# Patient Record
Sex: Male | Born: 1945
Health system: Southern US, Community
[De-identification: ages and names within clinical notes are randomized; demographics above are authoritative.]

## PROBLEM LIST (undated history)

## (undated) DIAGNOSIS — F419 Anxiety disorder, unspecified: Secondary | ICD-10-CM

## (undated) DIAGNOSIS — I10 Essential (primary) hypertension: Secondary | ICD-10-CM

## (undated) DIAGNOSIS — I671 Cerebral aneurysm, nonruptured: Secondary | ICD-10-CM

## (undated) HISTORY — PX: BRAIN SURGERY: SHX531

## (undated) HISTORY — PX: CATARACT EXTRACTION W/ INTRAOCULAR LENS IMPLANT: SHX1309

---

## 2005-01-28 ENCOUNTER — Emergency Department (HOSPITAL_COMMUNITY): Admission: EM | Admit: 2005-01-28 | Discharge: 2005-01-28 | Payer: Self-pay | Admitting: Emergency Medicine

## 2005-11-26 ENCOUNTER — Emergency Department (HOSPITAL_COMMUNITY): Admission: EM | Admit: 2005-11-26 | Discharge: 2005-11-26 | Payer: Self-pay | Admitting: Emergency Medicine

## 2005-11-28 ENCOUNTER — Ambulatory Visit (HOSPITAL_COMMUNITY): Admission: RE | Admit: 2005-11-28 | Discharge: 2005-11-28 | Payer: Self-pay | Admitting: Emergency Medicine

## 2005-12-05 ENCOUNTER — Encounter: Payer: Self-pay | Admitting: Interventional Radiology

## 2006-02-12 ENCOUNTER — Inpatient Hospital Stay (HOSPITAL_COMMUNITY): Admission: RE | Admit: 2006-02-12 | Discharge: 2006-02-13 | Payer: Self-pay | Admitting: Interventional Radiology

## 2006-02-14 ENCOUNTER — Emergency Department (HOSPITAL_COMMUNITY): Admission: EM | Admit: 2006-02-14 | Discharge: 2006-02-14 | Payer: Self-pay | Admitting: Emergency Medicine

## 2006-05-02 ENCOUNTER — Emergency Department (HOSPITAL_COMMUNITY): Admission: EM | Admit: 2006-05-02 | Discharge: 2006-05-02 | Payer: Self-pay | Admitting: Emergency Medicine

## 2006-06-19 ENCOUNTER — Ambulatory Visit (HOSPITAL_COMMUNITY): Admission: RE | Admit: 2006-06-19 | Discharge: 2006-06-19 | Payer: Self-pay | Admitting: Interventional Radiology

## 2006-07-26 ENCOUNTER — Emergency Department (HOSPITAL_COMMUNITY): Admission: EM | Admit: 2006-07-26 | Discharge: 2006-07-27 | Payer: Self-pay | Admitting: Emergency Medicine

## 2008-02-28 ENCOUNTER — Encounter: Payer: Self-pay | Admitting: Family Medicine

## 2008-03-25 ENCOUNTER — Inpatient Hospital Stay (HOSPITAL_COMMUNITY): Admission: RE | Admit: 2008-03-25 | Discharge: 2008-03-26 | Payer: Self-pay | Admitting: Interventional Radiology

## 2008-04-09 ENCOUNTER — Encounter: Payer: Self-pay | Admitting: Interventional Radiology

## 2008-07-17 ENCOUNTER — Ambulatory Visit (HOSPITAL_COMMUNITY): Admission: RE | Admit: 2008-07-17 | Discharge: 2008-07-17 | Payer: Self-pay | Admitting: Interventional Radiology

## 2008-12-31 ENCOUNTER — Ambulatory Visit (HOSPITAL_COMMUNITY): Admission: RE | Admit: 2008-12-31 | Discharge: 2008-12-31 | Payer: Self-pay | Admitting: Interventional Radiology

## 2009-06-28 ENCOUNTER — Ambulatory Visit (HOSPITAL_COMMUNITY): Admission: RE | Admit: 2009-06-28 | Discharge: 2009-06-28 | Payer: Self-pay | Admitting: Interventional Radiology

## 2010-04-05 ENCOUNTER — Ambulatory Visit (HOSPITAL_COMMUNITY): Admission: RE | Admit: 2010-04-05 | Discharge: 2010-04-05 | Payer: Self-pay | Admitting: Interventional Radiology

## 2010-12-18 ENCOUNTER — Encounter: Payer: Self-pay | Admitting: Interventional Radiology

## 2011-02-14 LAB — BASIC METABOLIC PANEL
BUN: 11 mg/dL (ref 6–23)
CO2: 27 mEq/L (ref 19–32)
Calcium: 8.8 mg/dL (ref 8.4–10.5)
Glucose, Bld: 100 mg/dL — ABNORMAL HIGH (ref 70–99)
Potassium: 3.9 mEq/L (ref 3.5–5.1)
Sodium: 137 mEq/L (ref 135–145)

## 2011-02-14 LAB — CBC
HCT: 42.2 % (ref 39.0–52.0)
Hemoglobin: 14.7 g/dL (ref 13.0–17.0)
MCHC: 34.9 g/dL (ref 30.0–36.0)
Platelets: 226 10*3/uL (ref 150–400)
RDW: 15.4 % (ref 11.5–15.5)

## 2011-03-04 LAB — CBC
HCT: 41.9 % (ref 39.0–52.0)
MCHC: 34 g/dL (ref 30.0–36.0)
MCV: 93.6 fL (ref 78.0–100.0)
Platelets: 193 10*3/uL (ref 150–400)
RDW: 15 % (ref 11.5–15.5)

## 2011-03-04 LAB — BASIC METABOLIC PANEL
BUN: 11 mg/dL (ref 6–23)
Chloride: 105 mEq/L (ref 96–112)
Glucose, Bld: 88 mg/dL (ref 70–99)
Potassium: 3.3 mEq/L — ABNORMAL LOW (ref 3.5–5.1)

## 2011-03-14 LAB — BASIC METABOLIC PANEL
BUN: 11 mg/dL (ref 6–23)
CO2: 26 mEq/L (ref 19–32)
Chloride: 102 mEq/L (ref 96–112)
Creatinine, Ser: 0.8 mg/dL (ref 0.4–1.5)
Glucose, Bld: 79 mg/dL (ref 70–99)

## 2011-03-14 LAB — PROTIME-INR: Prothrombin Time: 12.6 seconds (ref 11.6–15.2)

## 2011-03-14 LAB — CBC
MCHC: 35.1 g/dL (ref 30.0–36.0)
MCV: 90.1 fL (ref 78.0–100.0)
Platelets: 224 10*3/uL (ref 150–400)
RBC: 4.55 MIL/uL (ref 4.22–5.81)

## 2011-04-11 NOTE — Discharge Summary (Signed)
NAME:  Brett Elliott, Brett Elliott NO.:  [id number]   MEDICAL RECORD NO.:  [id number]          PATIENT TYPE:  INP   LOCATION:  3114                         FACILITY:  [facility]   PHYSICIAN:  Sanjeev K. Deveshwar, M.D.DATE OF BIRTH:  June 26, 1946   DATE OF ADMISSION:  03/25/2008  DATE OF DISCHARGE:  03/26/2008                               DISCHARGE SUMMARY   REFERRING PHYSICIAN:  Angus G. McInnis, MD   CHIEF COMPLAINT:  Cerebral aneurysm.   HISTORY OF PRESENT ILLNESS:  This is a pleasant 65 year old male who was  referred to Dr. Corliss Skains through the courtesy of Dr. Butch Penny in  2007 after this patient was found to have a right middle cerebral artery  aneurysm.  The patient was having headaches.  An MRI scan was performed  which did reveal the aneurysm.   On February 12, 2006 the patient underwent coiling of an 8 x 7 mm right  middle cerebral artery aneurysm performed by Dr. Corliss Skains under general  anesthesia.  The patient had some agitation when recovering from  anesthesia.  He also found it very uncomfortable to have a Foley  catheter placed   At the time of the coiling, Dr. Corliss Skains felt that the patient would  need a staged procedure in the future due to the wide neck of the  aneurysm.  He felt that stenting and possibly further coiling would be  needed.  The patient was scheduled for a followup procedure; however,  due to some family issues, the procedure was delayed and the patient  subsequently lost to followup.   The patient was seen again in consultation by Dr. Corliss Skains on February 28, 2008.  At that time further treatment options were discussed along with  the risks and benefits.  The patient was admitted to [facility]  on March 25, 2008, for further endovascular treatment.   PAST MEDICAL HISTORY:  Significant for,  1. Hyperlipidemia.  2. Hypertension.  3. History of borderline diabetes mellitus.  4. History of previous colonoscopy revealing a benign  polyp.   ALLERGIES:  The patient is allergic to [facility] which causes anaphylaxis.   PAST SURGICAL HISTORY:  The patient has had no major surgeries.  He does  have agitation when recovering from anesthesia following his coiling of  his aneurysm.   ADMISSION MEDICATIONS:  Include aspirin, Plavix, Ambien, omeprazole,  lorazepam, Diovan, and Hydrocodone.  The patient previously had been on  antidepressant; however, this was stopped at some point.   SOCIAL HISTORY:  The patient is married.  He has 5 children.  He has a  long history of tobacco use.  He smokes one to one-half pack of  cigarettes per day.  He did quit for 2 years after his aneurysm coiling;  however, he has since resumed.  He is trying to quit once again.  The  patient does not use alcohol.  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   FAMILY HISTORY:  His mother died at age 53 from colon cancer and a  hemorrhagic CVA.  His father died at age 73 from a cerebral aneurysm.   HOSPITAL COURSE:  As noted, this patient was admitted to [facility] on March 25, 2008, for further treatment of a right middle  cerebral artery aneurysm that was initially coiled on February 12, 2006.  The patient underwent a cerebral angiogram on the day of admission  followed by stent-assisted coiling under general anesthesia performed by  Dr. Corliss Skains.  There were no immediate or known complications.   The patient was then admitted to the neuro intensive care unit where he  remained on IV heparin overnight.  The heparin was discontinued the  following day.  The right femoral groin sheath was removed.  The patient  was placed on bedrest for 6 hours.   At the end of the bedrest, the patient will be ambulated.  If he remains  medically stable, he will be discharged in stable and improved  condition.   LABORATORY DATA:  A basic metabolic panel on the day of discharge  revealed a BUN of 5, creatinine  was 0.81, GFR was greater than 60,  potassium was 3.6, and glucose was 108.  The CBC on the day of discharge  revealed hemoglobin 12.8, hematocrit 37.5, WBCs 10.8000, and platelets  236,000.   DISCHARGE INSTRUCTIONS:  The patient was told to resume his previous  home medications.  He has been on continuous Plavix which he will also  resume along with aspirin 81 mg daily, which he has also been taking.   The patient was given instructions regarding his wound care.  He was  told not to drive or do anything strenuous for at least 2 weeks.  He  will follow up with Dr. Corliss Skains in approximately 2 weeks.  He was told  to see his primary care physician, Dr. Butch Penny, as needed or as  scheduled.   PROBLEMS AT THE TIME OF DISCHARGE:  1. Right middle cerebral artery aneurysm measuring 8 x 7 mm initially      coiled by Dr. Corliss Skains on February 12, 2006 as a staged procedure.  2. Stent-assisted coiling performed on the same aneurysm on March 25, 2008 as part of the staged procedure.  3. History of hyperlipidemia.  4. History of hypertension.  5. Questionable borderline diabetes.  6. History of benign colon polyps.  7. Ongoing tobacco use.  8. Agitation following anesthesia.  9. 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   As noted, the patient will follow up with Dr. Corliss Skains in 2 weeks.  He  was told to stay on a low-fat, low-sodium diet, and to avoid  concentrated sweets.      Delton See, P.A.    ______________________________  Grandville Silos. Corliss Skains, M.D.    DR/MEDQ  D:  03/26/2008  T:  03/27/2008  Job:  [id number]   cc:   Angus G. Renard Matter, MD

## 2011-04-11 NOTE — H&P (Signed)
NAME:  Brett Elliott, Brett Elliott NO.:  [id number]   MEDICAL RECORD NO.:  [id number]          PATIENT TYPE:  AMB   LOCATION:  SDS                          FACILITY:  [facility]   PHYSICIAN:  Sanjeev K. Deveshwar, M.D.DATE OF BIRTH:  1946/09/21   DATE OF ADMISSION:  03/25/2008  DATE OF DISCHARGE:                              HISTORY & PHYSICAL   CHIEF COMPLAINT:  Cerebral aneurysm.   HISTORY OF PRESENT ILLNESS:  This is a pleasant 65 year old male who was  referred to Dr. Corliss Skains through the courtesy of Dr. Renard Matter in 2007  after the patient was found to have a right middle cerebral artery  aneurysm.  The patient had been having headaches and MRI scan was  performed which did reveal the aneurysm.   On February 12, 2006, the patient underwent coiling of an 8 x 7-mm right  middle cerebral artery aneurysm performed by Dr. Corliss Skains under general  anesthesia.  The patient had some agitation when recovering from the  anesthesia.  He found it very uncomfortable to have a Foley catheter  placed.   At that time of the coiling, Dr. Corliss Skains felt that this would need to  be a staged procedure due to the wide neck of the aneurysm.  He felt a  stent or further coiling would be needed at some point in the future.  The patient was scheduled for a followup procedure; however, there were  some family issues and the procedure had to be delayed.  The patient was  subsequently lost to followup.  He was recently seen in consultation by  Dr. Corliss Skains on February 28, 2008.  Once again, the aneurysm treatment  options were discussed along with the risks and benefits.  Arrangements  were made to admit the patient to [facility] on March 25, 2008  for further treatment of his aneurysm.   PAST MEDICAL HISTORY:  Significant for:  1. Hyperlipidemia.  2. Hypertension.  3. History of borderline diabetes mellitus.  4. History of a previous colonoscopy revealing a benign polyp.   ALLERGIES:  KEFLEX  causes anaphylaxis.   SURGICAL HISTORY:  The patient has had no major surgeries, although he  did have agitation when recovering from anesthesia following his initial  coiling of his aneurysm.   MEDICATIONS ON ADMISSION:  Include aspirin, Plavix, Ambien, omeprazole,  lorazepam, Diovan, Hydrocodone.  The patient had previously been on an  antidepressant, but I believe this was stopped.   SOCIAL HISTORY:  The patient is married.  He has 5 children.  He has a  long history of tobacco use, greater than 40 years; he smokes 1-1/2  packs of cigarettes per day.  He quit for 2 years after his aneurysm  coiling; however, he has since resumed smoking approximately 1-1/2 pack  of cigarettes per day.  He does not use alcohol.  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   FAMILY HISTORY:  His mother died at age 58 from colon cancer and a  hemorrhagic CVA.  His father died at age 69  from a cerebral aneurysm.   REVIEW OF SYSTEMS:  The patient notes occasional chest discomfort which  he feels is related to difficulty swallowing his medications.  He denies  any shortness of breath.  He has had a cough productive of yellow sputum  which he attributes to allergies.  He has occasional headaches.  He  reports bruising easily while on the Plavix.  As noted, there is a  history of borderline diabetes, although his glucose on admission is 94.   LABORATORY DATA:  An INR is 0.8; a PTT is 30.  BUN was 12, creatinine  0.84, potassium 4.1.  GFR greater than 60.  Glucose 94.  Hemoglobin  14.8, hematocrit 44.1, WBC 9900, platelets 237,000.   PHYSICAL EXAM:  GENERAL:  Exam reveals a 65 year old white male in no  acute distress.  VITAL SIGNS:  Blood pressure 135/84, pulse 88, respirations 20,  temperature 98.1, oxygen saturation 96% on room air.  HEENT:  Unremarkable.  His airway is rated at a 1.  His ASA scale is rated at a  3.  NECK:  Reveals no bruits.  HEART:  Reveals  regular rate and rhythm without murmurs.  LUNGS:  Slightly decreased, but clear.  ABDOMEN:  Soft and nontender.  EXTREMITIES:  Reveal pulses to be intact.  There is no significant  edema.  NEUROLOGICAL:  Mental Status:  The patient is alert and oriented and  follows commands.  Cranial nerves II-XII are grossly intact.  Sensation  is intact to light touch.  Motor strength is approximately 5/5.  Cerebellar testing is intact.   IMPRESSION:  1. Right middle cerebral artery aneurysm measuring 8 x 7 mm, initially      coiled by Dr. Corliss Skains on February 12, 2006 as a staged procedure.  2. The patient now returns for further treatment of this wide-necked      aneurysm with possible further coiling and/or stenting.  3. Hyperlipidemia.  4. Hypertension.  5. Questionable borderline diabetes.  6. History of a benign colon polyp.  7. Ongoing tobacco use.  8. History of hypertension .  9. Agitation following anesthesia.  10.Allergy to [facility] resulting in anaphylaxis.   PLAN:  As noted, the patient will undergo further evaluations of his  existing aneurysm today with a cerebral angiogram performed under  conscious sedation and possible further endovascular treatment of the  aneurysm to be performed by Dr. Corliss Skains under general anesthesia.     Delton See, P.A.    ______________________________  Grandville Silos. Corliss Skains, M.D.   DR/MEDQ  D:  03/25/2008  T:  03/25/2008  Job:  [id number]   cc:   Angus G. Renard Matter, MD

## 2011-04-11 NOTE — Consult Note (Signed)
NAME:  Brett Elliott, Brett Elliott NO.:  [id number]   MEDICAL RECORD NO.:  [id number]          PATIENT TYPE:  OUT   LOCATION:  XRAY                         FACILITY:  [facility]   PHYSICIAN:  Sanjeev K. Deveshwar, M.D.DATE OF BIRTH:  [birth year]   DATE OF CONSULTATION:  02/28/2008  DATE OF DISCHARGE:                                 CONSULTATION   DATE OF CONSULTATION:  February 28, 2008.   CHIEF COMPLAINT:  Cerebral aneurysm.   HISTORY OF PRESENT ILLNESS:  This is a 65 year old male who was referred  to Dr. Corliss Skains through the courtesy of Dr. Renard Matter back in 2007 after  the patient was found to have a right middle cerebral artery aneurysm.  The patient had been having headaches and had an MRI scan performed  which did reveal the aneurysm.   On February 12, 2006, the patient underwent coiling of an 8 x 7 mm right  middle cerebral artery aneurysm performed by Dr. Corliss Skains under general  anesthesia.  The patient did have some agitation when recovering from  the anesthesia.  He also found it very uncomfortable to have a Foley  catheter placed.   At the time of the coiling, Dr. Corliss Skains felt that this would need to  be a staged procedure due to a wide neck on the aneurysm.  He felt that  a stent or further coiling would be needed at some point in the future.  The patient was scheduled for a follow up procedure.  However, there  were some family issues for the patient and the procedure was delayed.  The patient was subsequently lost to followup.  The patient returns  today accompanied by his wife for further followup and treatment  recommendations.   PAST MEDICAL HISTORY:  1. Significant for hyperlipidemia.  2. Hypertension.  3. Borderline diabetes mellitus.  4. The patient has had a previous colonoscopy revealing a benign      polyp.   ALLERGIES:  KEFLEX causes anaphylaxis.   CURRENT MEDICATIONS:  1. Ambien CR 12.5 mg at bedtime p.r.n.  2. Aspirin 81 mg daily.  3. Omeprazole  20 mg daily.  4. Plavix 75 mg daily.  5. Lorazepam 1 mg b.i.d. p.r.n.  6. Hydrocodone 5/500 one q.4 h. p.r.n.  7. Diovan 160 mg daily.  8. The patient was previously on an antidepressant, however, he ran      out of this medication.  He does not recall the name, but he feels      he was doing better while on the medication.   SOCIAL HISTORY:  The patient is married.  He has 5 children.  At the  time that the patient was due for further follow up with Dr. Corliss Skains,  the accident was quite severe and the patient's followup with Dr.  Corliss Skains had been postponed.  The patient has been smoking for greater  than 40 years.  He smokes 1 to 1-1/2 packs per day.  He did quit for 2  years after his aneurysm coiling, however, he has since resumed smoking  approximately one-half pack of cigarettes per day.  He  does not use  alcohol.  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   FAMILY HISTORY:  His mother died at age 60 from colon cancer and a  hemorrhagic CVA.  His father died at age 21 from a cerebral aneurysm.   IMPRESSION/PLAN:  As noted, the patient returns today accompanied by his  wife to be seen in followup by Dr. Corliss Skains after undergoing partial  coiling of a 8 x 7 mm right middle cerebral artery aneurysm performed  February 12, 2006.  The patient reports that he has been having headaches  now for at least a year.  His wife reports that he is also having some  right neck pain and memory problems.  They have since had some financial  problems.  For a while, the patient was unable to afford his  medications.  He was taking his Plavix once only every 3 days.  He had  been on an antidepressant which he was unable to afford as well.  He is  now on Medicaid and is able to afford his medications.  Unfortunately,  he has resumed smoking after quitting for 2 years.   The patient did have an MRI on May 02, 2006.  He became very  claustrophobic during the  procedure and required sedation.  This did  reveal a 3.5 x 2 mm residual aneurysm.  Dr. Corliss Skains reviewed the  images from the initial procedure with the patient and his wife.  All of  their questions were answered.  Dr. Corliss Skains has recommended a follow  up cerebral angiogram to further evaluate the status of the aneurysm at  this time.  He felt that possibly further coiling and/or stenting may be  required.  We will schedule the patient's procedure with anesthesia with  plans to proceed if further intervention is felt to be safe and  indicated.  The patient requests heavy sedation.  He also requests a  condom catheter be used as opposed to an indwelling Foley catheter.   Greater than greater than 40 minutes was spent on this consult.      Delton See, P.A.    ______________________________  Grandville Silos. Corliss Skains, M.D.    DR/MEDQ  D:  02/28/2008  T:  02/28/2008  Job:  [id number]   cc:   Angus G. Renard Matter, MD

## 2011-04-14 NOTE — Discharge Summary (Signed)
NAME:  Brett Elliott, Brett Elliott NO.:  [id number]   MEDICAL RECORD NO.:  [id number]          PATIENT TYPE:  INP   LOCATION:  3109                         FACILITY:  [facility]   PHYSICIAN:  Sanjeev K. Deveshwar, M.D.DATE OF BIRTH:  Jun 14, 1946   DATE OF ADMISSION:  02/12/2006  DATE OF DISCHARGE:  02/13/2006                                 DISCHARGE SUMMARY   BRIEF HISTORY:  This is a 65 year old male who was evaluated at [facility], on November 26, 2005, for dizziness.  A CT scan was initially  negative.  The patient had an MRI performed, November 28, 2005, that revealed  an 8 x 7-mm right middle cerebral artery aneurysm.  The patient was seen in  consultation by Dr. Corliss Skains, on December 05, 2005, and arrangements were  made for the patient to return to [facility], on February 12, 2006,  for a cerebral angiogram and possible coiling of the aneurysm.   PAST MEDICAL HISTORY:  1.  Hyperlipidemia.  2.  Borderline hypertension.  3.  Borderline diabetes.  4.  Occasional indigestion.  5.  Previous colonoscopy with a benign polyp.   ALLERGIES:  KEFLEX apparently has caused anaphylaxis.   MEDICATIONS:  Prior to admission included hydrocodone, meclizine, lorazepam,  and cough syrup p.r.n.  He was also on a low dose aspirin daily.   SOCIAL HISTORY:  The patient is married.  He has five children.  He lives in  [place] with his wife.  He has been smoking for more than 40 years.  He  smokes 1 and 1.5 packs of cigarettes per day.  He does not use alcohol.  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   FAMILY HISTORY:  His mother died at age 7 from colon cancer and a  hemorrhagic CVA.  His father died at age 34 from an aneurysm.   HOSPITAL COURSE:  As noted this patient was admitted to [facility],  on February 12, 2006, for further evaluation and treatment of a cerebral  aneurysm.  The patient had a cerebral angiogram performed on the morning of  admission.   This did reveal a right middle cerebral artery aneurysm as  previously noted by MRI [facility].  The findings were discussed with the patient  and his wife and a decision was made to proceed with coiling of the  aneurysm.  The patient was placed under general anesthesia and the aneurysm was coiled  by Dr. Corliss Skains.  The patient tolerated this well, although he did have  some formation of clots at the site of the aneurysm during the procedure.  For this reason, IV ReoPro was given along with IV heparin.  On coming out  of the anesthesia, the patient did  have an episode of confusion and  combativeness.  He did need to be restrained and was subsequently admitted  to the neuro intensive care unit.  The patient was monitored closely overnight on IV heparin.  The heparin was  discontinued the following day and the right femoral groin sheath was  removed.  The patient  had some mild elevation in his blood pressure that did  require treatment with IV hydralazine.  He was started on Norvasc 5 mg daily  prior to discharge.  The patient did well throughout that day.  He remained on bed rest for six  hours.  Plan at this time is to ambulate the patient once his bed rest is  up.  If he remains stable, he will be discharged in improved and stable  condition.   LABORATORY DATA:  A B-MET, on the day of discharge, revealed a BUN of 8,  creatinine 0.9, potassium 3.6, glucose 98.  His CBC revealed hemoglobin  12.6, hematocrit 37.3, WBCs 11.4 thousand, platelets 233,000.  The patient  was afebrile.   DISCHARGE INSTRUCTIONS:  1.  The patient was told to continue his home medications.  2.  He was to stay on Plavix 75 mg daily for seven days. He was given a      prescription for Plavix.  3.  He was told to take aspirin 81 mg daily.  4.  He was started on Norvasc 5 mg daily for his blood pressure.  5.  He requested a prescription for pain medication, and he was given      Vicodin, #24, to be taken 1-2 q.4-6h.  p.r.n. for pain.  6.  The patient was told to quit smoking.  7.  He was to stay on a low sodium diet.  8.  He was to avoid any driving or anything strenuous for at least two      weeks.   1.  A followup angiogram was recommended in two to three months.  2.  He was told follow up with his primary care physician, Dr. Renard Matter, in      one to two weeks for further evaluation of his blood pressure.  3.  He was to see Dr. Corliss Skains on February 26, 2006 at 3 p.m. for further      followup.   PROBLEM LIST AT TIME OF DISCHARGE:  1.  Right middle cerebral artery aneurysm, status post coiling.  2.  Episode of confusion and combativeness following anesthesia which did      resolved.  3.  Hypertension, treated with Norvasc.  4.  Mildly elevated white blood cell count with normal temperature.  5.  Hyperlipidemia.  6.  Borderline diabetes mellitus.  7.  History of indigestion.  8.  History of colonoscopy with benign polyp.  9.  Allergy to [facility].   ADDENDUM:  It should be noted that Dr. Corliss Skains felt that the patient might  require the placement of a neuroform stent in several months due to the fact  that the aneurysm had a broad neck and it may require further intervention  in the not too distant future.  This will be evaluated at the time of his  next angiogram in approximately three months.      Delton See, P.A.    ______________________________  Grandville Silos. Corliss Skains, M.D.   DR/MEDQ  D:  02/13/2006  T:  02/13/2006  Job:  [id number]   cc:   Angus G. Renard Matter, MD  Fax: [phone]

## 2011-04-14 NOTE — Consult Note (Signed)
NAME:  Brett Elliott, Brett Elliott NO.:  [id number]   MEDICAL RECORD NO.:  [id number]          PATIENT TYPE:  OUT   LOCATION:  XRAY                         FACILITY:  [facility]   PHYSICIAN:  Sanjeev K. Deveshwar, M.D.DATE OF BIRTH:  [birth year]   DATE OF CONSULTATION:  12/05/2005  DATE OF DISCHARGE:                                   CONSULTATION   CHIEF COMPLAINT:  Cerebral aneurysm.   HISTORY OF PRESENT ILLNESS:  This is a 65 year old male who became dizzy on  November 26, 2005.  He described feeling a sudden onset of dizziness with  presyncope and weakness.  He came to the emergency room at [facility].  A head CT scan was negative except for a tiny focus of increased  attenuation in the posterior right frontal lobe which was felt to bed  nonspecific.  The patient was treated with meclizine, and arrangements were  made for a followup MRI on November 28, 2005. This showed no ischemia, but the  patient did have an 8 x 7 mm right middle cerebral artery aneurysm.  He was  referred to Dr. Corliss Skains for further evaluation.   PAST MEDICAL HISTORY:  Significant for:  1.  Hyperlipidemia.  2.  Borderline diabetes.  3.  Borderline hypertension.  4.  Occasional indigestion.  5.  Previous colonoscopy with a benign polyp.   ALLERGIES:  KEFLEX.   CURRENT MEDICATIONS:  Include meclizine, a nerve pill, and occasional  aspirin.   SOCIAL HISTORY:  The patient is married.  He has five children.  He lives in  [place] with his wife.  He has been smoking for greater than 40 years, 1  to 1-1/2 packs of cigarettes per day.  He does not use alcohol.  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   FAMILY HISTORY:  His mother died at age 85.  She had colon cancer and a  hemorrhagic CVA.  His father died at age 21 from an aneurysm.   IMPRESSION AND PLAN:  Dr. Corliss Skains met with the patient and his wife today.  They reviewed the results of his MRI.  Aneurysms were discussed in  detail  along with treatment options including the risks and benefits.  The patient  and his wife would like to proceed with coiling of the aneurysm.  This will  be scheduled as soon as possible but probably no sooner than February due to  Dr. Fatima Sanger schedule.  In the interim, Dr. Corliss Skains told the  patient he could return to work, but he should avoid lifting more than 25  pounds and avoid stooping if possible.  We will contact the patient at home  once we have been able to schedule the coiling with anesthesia.   Greater than 60 minutes were spent on this consult today.      Delton See, P.A.    ______________________________  Grandville Silos. Corliss Skains, M.D.    DR/MEDQ  D:  12/05/2005  T:  12/05/2005  Job:  [id number]   cc:   Angus G. Renard Matter, MD  Fax: [phone]

## 2011-04-14 NOTE — H&P (Signed)
NAME:  [place], [place] NO.:  [id number]   MEDICAL RECORD NO.:  [id number]          PATIENT TYPE:  AMB   LOCATION:  SDS                          FACILITY:  [facility]   PHYSICIAN:  Sanjeev K. Deveshwar, M.D.DATE OF BIRTH:  May 06, 1946   DATE OF ADMISSION:  02/12/2006  DATE OF DISCHARGE:                                HISTORY & PHYSICAL   CHIEF COMPLAINT:  Cerebral aneurysm.   HISTORY OF PRESENT ILLNESS:  This is a 65 year old male who had an episode  of dizziness November 26, 2005.  A CT scan was initially negative.  The  patient was scheduled for an MRI performed November 28, 2005.  This showed an  8 x 7 mm right middle cerebral artery aneurysm.  The patient was seen by Dr.  Corliss Skains in consultation on December 05, 2005 and arrangements were made for  the patient to return today for cerebral angiogram and possibly coiling of  the aneurysm.   PAST MEDICAL HISTORY:  1.  Hyperlipidemia.  2.  Borderline diabetes mellitus.  3.  Borderline hypertension.  4.  Occasional indigestion.  5.  Previous colonoscopy with a benign polyp.   ALLERGIES:  KEFLEX, apparently this causes anaphylaxis.   CURRENT MEDICATIONS:  Hydrocodone, meclizine, lorazepam, as well as a cough  syrup and a daily low-dose aspirin.   SOCIAL HISTORY:  The patient is married.  He has five children.  He lives in  [place] with his wife.  He has been smoking for more than 40 years.  He  smokes one to one and a half packs of cigarettes per day.  Does not use  alcohol.  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   FAMILY HISTORY:  His mother died at age 31.  She had colon cancer as well as  a hemorrhagic CVA.  His father died at age 75 from an aneurysm.   LABORATORY DATA:  An INR is 0.9.  A PTT is 30.  CBC reveals hemoglobin 14.7,  hematocrit 43.2, WBC 9.1, platelets 253,000.  BUN is 14, creatinine 0.9,  potassium 4.2, glucose is 90.   REVIEW OF SYSTEMS:  The patient states he has occasional chest  pain often  when bending over.  He denies any shortness of breath, any coughing or  wheezing.  He has an occasional headache.  He denies any GI or GU symptoms.  He has some mild arthritis.  He denies any bleeding problems.  He has  borderline diabetes mellitus.  He has a history of anxiety.   PHYSICAL EXAMINATION:  GENERAL:  Pleasant, but somewhat sluggish 65 year old  white male in no acute distress.  VITAL SIGNS:  Blood pressure 146/81, pulse 88, respirations 18, temperature  97.8.  HEENT:  Pupils small and sluggish, otherwise unremarkable.  NECK:  No bruits.  No jugular venous distention.  HEART:  Regular rate and rhythm with distant heart sounds.  LUNGS:  Decreased breath sounds with mild rhonchi.  EXTREMITIES:  Pulses intact without edema.  NEUROLOGIC:  The patient is somewhat slow to follow commands.  He is alert  and oriented.  Cranial nerves II-XII are grossly intact except for pupils as  noted.  Motor strength is 4/5 throughout.  Cerebellar testing is slow, but  intact.  Sensation is intact to light touch.   IMPRESSION:  1.  Right middle cerebral artery aneurysm.  2.  History of hyperlipidemia.  3.  Borderline diabetes.  4.  Hypertension.  5.  Tobacco history.  6.  Allergy to Keflex which causes anaphylaxis.  7.  No major surgeries in the past.  8.  Somewhat sluggish behavior.  Patient admits to having taken a nerve      pill, pain pill, and cough syrup this morning as well as the vancomycin,      aspirin, and nimodipine that were given pre procedurally.   PLAN:  The patient will undergo cerebral angiogram this morning with  possible coiling of the aneurysm and/or possible stenting.      Delton See, P.A.    ______________________________  Grandville Silos. Corliss Skains, M.D.    DR/MEDQ  D:  02/12/2006  T:  02/12/2006  Job:  [id number]   cc:   Angus G. Renard Matter, MD  Fax: [phone]

## 2011-04-14 NOTE — Consult Note (Signed)
NAME:  [place], PEPP[place] NO.:  [id number]   MEDICAL RECORD NO.:  [id number]          PATIENT TYPE:  OUT   LOCATION:  XRAY                         FACILITY:  [facility]   PHYSICIAN:  Sanjeev K. Deveshwar, M.D.DATE OF BIRTH:  06/30/1946   DATE OF CONSULTATION:  02/26/2006  DATE OF DISCHARGE:  02/26/2006                                   CONSULTATION   REFERRING PHYSICIAN:  Dr. Ishmael Holter. McInnis.   BRIEF HISTORY:  Mr. Brozek is a pleasant 65 year old male who was found to  have an 8 x 7-mm right middle cerebral artery aneurysm by MRI performed  November 28, 2005 to evaluate the patient for symptoms of dizziness.  The  patient was referred to Dr. Corliss Skains.  He was admitted to [facility] on February 12, 2006 for coiling of the aneurysm.   PAST MEDICAL HISTORY:  Significant for:  1.  Hyperlipidemia.  2.  Hypertension.  3.  Borderline diabetes.  4.  Occasional indigestion.  5.  Previous colonoscopy revealing a benign polyp.   ALLERGIES:  KEFLEX causes anaphylaxis.   SOCIAL HISTORY:  The patient is married.  He has 5 children.  He lives in  [place] with his wife.  He has been smoking for more than 40 years; he  smokes 1-1/2 packs of cigarettes per day.  He does not use alcohol.  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   FAMILY HISTORY:  His mother died at age 82 from colon cancer and a  hemorrhagic CVA.  His father died at age 32 from an aneurysm.   IMPRESSION AND PLAN:  The patient returns today accompanied by his wife to  be seen approximately 2 week following the coiling of his right middle  cerebral artery aneurysm.  The patient did well following the intervention,  although he did have some difficulty waking from the anesthesia and became  confused and combative after he was extubated.   Today, the patient reports he has been doing well.  He has an occasional  headache, but no other neurologic-type symptoms.  He was treated with Plavix  for 1  week following his procedure; he remains on aspirin 81 mg daily.   We also started him on Norvasc 5 mg daily for hypertension while he was in  the hospital.  He reports that his blood pressure has been doing better,  although the diastolic is still high at times.  He states he feels better  since his blood pressure has been better controlled.  He may need to be  increased to 10 mg of the Norvasc per day.  We have asked him to follow up  with his primary care physician, Dr. Renard Matter, for further evaluation and  recommendations.   The patient was also having some groin pain following discharge.  Apparently, he was seen in the emergency room.  We did give him a  prescription for Vicodin, #24, no refill, to be taken as needed for groin  pain.   Overall, the patient has been doing well.  He has not yet returned to  work;  apparently, there are some problems with his employer and he may be filing  for disability.  Unfortunately, the patient continues to smoke; we have  encouraged him to quit and we tried to stress the importance of  discontinuing tobacco due to his aneurysm.  The aneurysm has been coiled,  although there is a possibility it can re-cannulize.  Dr. Corliss Skains felt  that it may require a Neuroform stent at some point in the future or  additional coils.   An angiogram has been recommended in approximately 3 months following the  procedure.  It will also be repeated at 1 year following the procedure.  Based on these findings, a determination will be made as to whether or not  the patient requires a placement of a Neuroform stent or possible additional  coils.  We will see the patient again in approximately 3 months.   Greater than 30 minutes were spent on this consult.      [place]ton See, P.A.    ______________________________  Grandville Silos. Corliss Skains, M.D.    DR/MEDQ  D:  02/26/2006  T:  02/28/2006  Job:  [id number]   cc:   Angus G. Renard Matter, MD  Fax: [phone]

## 2011-08-22 LAB — CBC
HCT: 37.5 — ABNORMAL LOW
HCT: 38 — ABNORMAL LOW
Hemoglobin: 12.8 — ABNORMAL LOW
Hemoglobin: 12.9 — ABNORMAL LOW
Hemoglobin: 14.8
MCHC: 33.5
MCV: 90.8
Platelets: 236
RBC: 4.2 — ABNORMAL LOW
RDW: 14.5
RDW: 15.1
WBC: 12.7 — ABNORMAL HIGH

## 2011-08-22 LAB — BASIC METABOLIC PANEL
BUN: 5 — ABNORMAL LOW
CO2: 28
CO2: 29
Calcium: 9.4
Chloride: 106
Glucose, Bld: 108 — ABNORMAL HIGH
Glucose, Bld: 94
Potassium: 3.6
Sodium: 139

## 2011-08-22 LAB — DIFFERENTIAL
Basophils Absolute: 0.1
Eosinophils Relative: 2
Lymphocytes Relative: 26
Lymphs Abs: 2.6
Monocytes Absolute: 0.7
Monocytes Relative: 8

## 2011-08-22 LAB — HEPARIN ANTI-XA: Heparin LMW: 0.18

## 2012-02-02 ENCOUNTER — Other Ambulatory Visit (HOSPITAL_COMMUNITY): Payer: Self-pay | Admitting: Interventional Radiology

## 2012-02-02 DIAGNOSIS — I729 Aneurysm of unspecified site: Secondary | ICD-10-CM

## 2012-02-07 ENCOUNTER — Other Ambulatory Visit: Payer: Self-pay | Admitting: Radiology

## 2012-02-07 ENCOUNTER — Encounter (HOSPITAL_COMMUNITY): Payer: Self-pay | Admitting: Pharmacy Technician

## 2012-02-08 ENCOUNTER — Ambulatory Visit (HOSPITAL_COMMUNITY)
Admission: RE | Admit: 2012-02-08 | Discharge: 2012-02-08 | Disposition: A | Discharge status: Home / Self Care | Payer: Medicare HMO | Source: Ambulatory Visit | Attending: Interventional Radiology | Admitting: Interventional Radiology

## 2012-02-08 ENCOUNTER — Other Ambulatory Visit (HOSPITAL_COMMUNITY): Payer: Self-pay | Admitting: Interventional Radiology

## 2012-02-08 DIAGNOSIS — E119 Type 2 diabetes mellitus without complications: Secondary | ICD-10-CM | POA: Insufficient documentation

## 2012-02-08 DIAGNOSIS — I729 Aneurysm of unspecified site: Secondary | ICD-10-CM

## 2012-02-08 DIAGNOSIS — I671 Cerebral aneurysm, nonruptured: Secondary | ICD-10-CM | POA: Insufficient documentation

## 2012-02-08 DIAGNOSIS — I10 Essential (primary) hypertension: Secondary | ICD-10-CM | POA: Insufficient documentation

## 2012-02-08 DIAGNOSIS — J4489 Other specified chronic obstructive pulmonary disease: Secondary | ICD-10-CM | POA: Insufficient documentation

## 2012-02-08 DIAGNOSIS — E785 Hyperlipidemia, unspecified: Secondary | ICD-10-CM | POA: Insufficient documentation

## 2012-02-08 DIAGNOSIS — Z9889 Other specified postprocedural states: Secondary | ICD-10-CM | POA: Insufficient documentation

## 2012-02-08 DIAGNOSIS — R51 Headache: Secondary | ICD-10-CM | POA: Insufficient documentation

## 2012-02-08 DIAGNOSIS — J449 Chronic obstructive pulmonary disease, unspecified: Secondary | ICD-10-CM | POA: Insufficient documentation

## 2012-02-08 DIAGNOSIS — I6509 Occlusion and stenosis of unspecified vertebral artery: Secondary | ICD-10-CM | POA: Insufficient documentation

## 2012-02-08 DIAGNOSIS — Z09 Encounter for follow-up examination after completed treatment for conditions other than malignant neoplasm: Secondary | ICD-10-CM | POA: Insufficient documentation

## 2012-02-08 LAB — POCT I-STAT, CHEM 8
BUN: 9 mg/dL (ref 6–23)
Creatinine, Ser: 0.9 mg/dL (ref 0.50–1.35)
Glucose, Bld: 82 mg/dL (ref 70–99)
Hemoglobin: 15.3 g/dL (ref 13.0–17.0)
Potassium: 4.1 mEq/L (ref 3.5–5.1)
TCO2: 26 mmol/L (ref 0–100)

## 2012-02-08 LAB — CBC
HCT: 42.4 % (ref 39.0–52.0)
MCH: 31.3 pg (ref 26.0–34.0)
MCV: 89.1 fL (ref 78.0–100.0)
Platelets: 219 10*3/uL (ref 150–400)
RDW: 14.9 % (ref 11.5–15.5)

## 2012-02-08 MED ORDER — HYDRALAZINE HCL 20 MG/ML IJ SOLN
INTRAMUSCULAR | Status: AC
  Administered 2012-02-08: 5 mg via INTRAVENOUS
  Filled 2012-02-08: qty 2

## 2012-02-08 MED ORDER — FENTANYL CITRATE 0.05 MG/ML IJ SOLN
INTRAMUSCULAR | Status: AC
  Filled 2012-02-08: qty 4

## 2012-02-08 MED ORDER — MIDAZOLAM HCL 5 MG/5ML IJ SOLN
INTRAMUSCULAR | Status: DC | PRN
  Administered 2012-02-08: 1 mg via INTRAVENOUS

## 2012-02-08 MED ORDER — SODIUM CHLORIDE 0.9 % IV SOLN: INTRAVENOUS | Status: AC

## 2012-02-08 MED ORDER — IOHEXOL 300 MG/ML  SOLN
150.0000 mL | Freq: Once | INTRAMUSCULAR | Status: AC | PRN
  Administered 2012-02-08: 66 mL via INTRA_ARTERIAL

## 2012-02-08 MED ORDER — SODIUM CHLORIDE 0.45 % IV SOLN
INTRAVENOUS | Status: DC
  Administered 2012-02-08: 10:00:00 via INTRAVENOUS

## 2012-02-08 MED ORDER — HEPARIN SODIUM (PORCINE) 1000 UNIT/ML IJ SOLN
INTRAMUSCULAR | Status: DC | PRN
  Administered 2012-02-08: 500 [IU] via INTRAVENOUS

## 2012-02-08 MED ORDER — MIDAZOLAM HCL 2 MG/2ML IJ SOLN
INTRAMUSCULAR | Status: AC
  Filled 2012-02-08: qty 4

## 2012-02-08 MED ORDER — FENTANYL CITRATE 0.05 MG/ML IJ SOLN
INTRAMUSCULAR | Status: DC | PRN
  Administered 2012-02-08: 25 ug via INTRAVENOUS

## 2012-02-08 NOTE — H&P (Signed)
[place] is an 66 y.o. male.   Chief Complaint: "I'm here for an angiogram" HPI: Patient with history of stent assisted coiling of right MCA trunk aneurysm 2009 presents today for follow up cerebral arteriogram.  PMH: hyperlipidemia, DM, HTN,COPD PSH: none No family history on file. Social History: SMOKER, LIVES IN [place], MARRIED WITH 5 CHILDREN, NO ALCOHOL, ON DISABILITY  Allergies:  Allergies  Allergen Reactions  . Keflex Rash    Medications Prior to Admission  Medication Sig Dispense Refill  . aspirin EC 325 MG tablet Take 325 mg by mouth daily.      . Aspirin-Salicylamide-Caffeine (BC HEADACHE POWDER PO) Take 1 packet by mouth daily as needed. For headache      . ibuprofen (ADVIL,MOTRIN) 200 MG tablet Take 400-600 mg by mouth every 6 (six) hours as needed. For pain      . lisinopril (PRINIVIL,ZESTRIL) 10 MG tablet Take 10 mg by mouth daily.      Marland Kitchen LORazepam (ATIVAN) 1 MG tablet Take 1 mg by mouth 2 (two) times daily as needed. For nerves      . rosuvastatin (CRESTOR) 20 MG tablet Take 20 mg by mouth daily.       Medications Prior to Admission  Medication Dose Route Frequency Provider Last Rate Last Dose  . 0.45 % sodium chloride infusion   Intravenous Continuous D Jeananne Rama, PA 75 mL/hr at 02/08/12 1009      Results for orders placed during the hospital encounter of 02/08/12 (from the past 48 hour(s))  CBC     Status: Normal   Collection Time   02/08/12 10:05 AM      Component Value Range Comment   WBC 9.8  4.0 - 10.5 (K/uL)    RBC 4.76  4.22 - 5.81 (MIL/uL)    Hemoglobin 14.9  13.0 - 17.0 (g/dL)    HCT 16.1  09.6 - 04.5 (%)    MCV 89.1  78.0 - 100.0 (fL)    MCH 31.3  26.0 - 34.0 (pg)    MCHC 35.1  30.0 - 36.0 (g/dL)    RDW 40.9  81.1 - 91.4 (%)    Platelets 219  150 - 400 (K/uL)    Results for orders placed during the hospital encounter of 02/08/12  APTT      Component Value Range   aPTT 28  24 - 37 (seconds)  CBC      Component Value Range   WBC  9.8  4.0 - 10.5 (K/uL)   RBC 4.76  4.22 - 5.81 (MIL/uL)   Hemoglobin 14.9  13.0 - 17.0 (g/dL)   HCT 78.2  95.6 - 21.3 (%)   MCV 89.1  78.0 - 100.0 (fL)   MCH 31.3  26.0 - 34.0 (pg)   MCHC 35.1  30.0 - 36.0 (g/dL)   RDW 08.6  57.8 - 46.9 (%)   Platelets 219  150 - 400 (K/uL)  PROTIME-INR      Component Value Range   Prothrombin Time 11.9  11.6 - 15.2 (seconds)   INR 0.86  0.00 - 1.49   BMP pending  Review of Systems  Constitutional: Negative for fever and chills.  HENT: Positive for neck pain.        Some right ear discomfort  Respiratory: Positive for cough and shortness of breath.   Cardiovascular: Negative for chest pain.  Gastrointestinal: Positive for nausea. Negative for vomiting and abdominal pain.  Neurological: Positive for dizziness and headaches. Negative for focal weakness.  Occ paresthesias bilateral feet  Endo/Heme/Allergies: Does not bruise/bleed easily.    Blood pressure 145/85, pulse 61, temperature 96.6 F (35.9 C), temperature source Oral, resp. rate 18, height 5\' 8"  (1.727 m), weight 223 lb (101.152 kg), SpO2 97.00%. Physical Exam  Constitutional: He is oriented to person, place, and time. He appears well-developed and well-nourished.  Cardiovascular: Normal rate and regular rhythm.   Respiratory: He has no wheezes.       Distant BS   GI: Soft. Bowel sounds are normal.  Musculoskeletal: Normal range of motion. He exhibits no edema.  Neurological: He is alert and oriented to person, place, and time. No cranial nerve deficit. Coordination normal.     Assessment/Plan Patient with history of stent assisted coiling of right middle cerebral artery trunk aneurysm 2009; plan is for follow up cerebral arteriogram to assess stability of cerebral vasculature.  [name],D KEVIN 02/08/2012, 10:46 AM

## 2012-02-08 NOTE — ED Notes (Signed)
Report received from Delmer Islam RN assumed care at this time pt stable.

## 2012-02-08 NOTE — Procedures (Signed)
S/P bilateral carotid artery and lt vert artery angiograms. RT CFA approach  Preliminary findings  1. Obliterated rt MVA aneurysm . No coil compaction

## 2012-02-08 NOTE — Discharge Instructions (Signed)

## 2012-08-03 ENCOUNTER — Encounter (HOSPITAL_COMMUNITY): Payer: Self-pay

## 2012-08-03 ENCOUNTER — Emergency Department (HOSPITAL_COMMUNITY)
Admission: EM | Admit: 2012-08-03 | Discharge: 2012-08-03 | Disposition: A | Discharge status: Home / Self Care | Payer: Medicare HMO | Attending: Emergency Medicine | Admitting: Emergency Medicine

## 2012-08-03 DIAGNOSIS — F411 Generalized anxiety disorder: Secondary | ICD-10-CM | POA: Insufficient documentation

## 2012-08-03 DIAGNOSIS — I10 Essential (primary) hypertension: Secondary | ICD-10-CM | POA: Insufficient documentation

## 2012-08-03 DIAGNOSIS — IMO0002 Reserved for concepts with insufficient information to code with codable children: Secondary | ICD-10-CM | POA: Insufficient documentation

## 2012-08-03 DIAGNOSIS — Z7982 Long term (current) use of aspirin: Secondary | ICD-10-CM | POA: Insufficient documentation

## 2012-08-03 DIAGNOSIS — F17200 Nicotine dependence, unspecified, uncomplicated: Secondary | ICD-10-CM | POA: Insufficient documentation

## 2012-08-03 DIAGNOSIS — L02412 Cutaneous abscess of left axilla: Secondary | ICD-10-CM

## 2012-08-03 DIAGNOSIS — Z881 Allergy status to other antibiotic agents status: Secondary | ICD-10-CM | POA: Insufficient documentation

## 2012-08-03 HISTORY — DX: Anxiety disorder, unspecified: F41.9

## 2012-08-03 HISTORY — DX: Cerebral aneurysm, nonruptured: I67.1

## 2012-08-03 HISTORY — DX: Essential (primary) hypertension: I10

## 2012-08-03 MED ORDER — DOXYCYCLINE CALCIUM 50 MG/5ML PO SYRP: 100.0000 mg | ORAL_SOLUTION | Freq: Two times a day (BID) | ORAL | Status: DC

## 2012-08-03 MED ORDER — HYDROCODONE-ACETAMINOPHEN 5-325 MG PO TABS
1.0000 | ORAL_TABLET | Freq: Once | ORAL | Status: AC
  Administered 2012-08-03: 1 via ORAL
  Filled 2012-08-03: qty 1

## 2012-08-03 MED ORDER — HYDROCODONE-ACETAMINOPHEN 7.5-500 MG/15ML PO SOLN: 10.0000 mL | ORAL | Status: AC | PRN

## 2012-08-03 MED ORDER — LIDOCAINE HCL (PF) 1 % IJ SOLN
5.0000 mL | Freq: Once | INTRAMUSCULAR | Status: AC
  Administered 2012-08-03: 5 mL via INTRADERMAL
  Filled 2012-08-03: qty 5

## 2012-08-03 MED ORDER — DOXYCYCLINE HYCLATE 100 MG PO TABS
100.0000 mg | ORAL_TABLET | Freq: Once | ORAL | Status: AC
  Administered 2012-08-03: 100 mg via ORAL
  Filled 2012-08-03: qty 1

## 2012-08-03 NOTE — ED Notes (Signed)
Pt presents with 2 abscesses in left axilla x 5 days. Pt states no fever, n/v at this time. Pt states has tried to "pop it" without success.  Pt has also used warm compresses for pain relief.

## 2012-08-03 NOTE — ED Provider Notes (Signed)
History     CSN: [id number]  Arrival date & time 08/03/12  1500   First MD Initiated Contact with Patient 08/03/12 1609      Chief Complaint  Patient presents with  . Arm Pain    (Consider location/radiation/quality/duration/timing/severity/associated sxs/prior treatment) Patient is a 66 y.o. male presenting with abscess. The history is provided by the patient.  Abscess  This is a new problem. The current episode started less than one week ago. The onset was gradual. The problem has been gradually worsening. Affected Location: left axilla. The problem is moderate. The abscess is characterized by redness, painfulness and swelling. It is unknown what he was exposed to. Pertinent negatives include no decrease in physical activity, no fever and no vomiting. His past medical history is significant for skin abscesses in family. There were sick contacts at home. He has received no recent medical care.    Past Medical History  Diagnosis Date  . Brain aneurysm   . Hypertension   . Anxiety     Past Surgical History  Procedure Date  . Brain surgery     No family history on file.  History  Substance Use Topics  . Smoking status: Current Everyday Smoker  . Smokeless tobacco: Not on file  . Alcohol Use: No      Review of Systems  Constitutional: Negative for fever and chills.  HENT: Negative for neck pain.   Respiratory: Negative for chest tightness and shortness of breath.   Gastrointestinal: Negative for nausea and vomiting.  Musculoskeletal: Negative for joint swelling and arthralgias.  Skin: Positive for color change.       Abscess   Neurological: Negative for weakness and numbness.  Hematological: Negative for adenopathy.  All other systems reviewed and are negative.    Allergies  Cephalexin  Home Medications   Current Outpatient Rx  Name Route Sig Dispense Refill  . ASPIRIN EC 325 MG PO TBEC Oral Take 325 mg by mouth daily.    . BC HEADACHE POWDER PO Oral Take  1 packet by mouth daily as needed. For headache    . ATORVASTATIN CALCIUM 20 MG PO TABS Oral Take 20 mg by mouth daily.    Marland Kitchen LISINOPRIL 10 MG PO TABS Oral Take 10 mg by mouth daily.    Marland Kitchen LORAZEPAM 1 MG PO TABS Oral Take 1 mg by mouth 2 (two) times daily as needed. For nerves      BP 153/86  Pulse 93  Temp 97.6 F (36.4 C) (Oral)  Resp 16  Ht 5\' 9"  (1.753 m)  Wt 235 lb (106.595 kg)  BMI 34.70 kg/m2  SpO2 98%  Physical Exam  Nursing note and vitals reviewed. Constitutional: He is oriented to person, place, and time. He appears well-developed and well-nourished. No distress.  HENT:  Head: Normocephalic and atraumatic.  Cardiovascular: Normal rate, regular rhythm and normal heart sounds.   Pulmonary/Chest: Effort normal and breath sounds normal.  Neurological: He is alert and oriented to person, place, and time. He exhibits normal muscle tone. Coordination normal.  Skin: Skin is warm. There is erythema.       Abscess x 2  to the left axilla.  Moderate erythema and induration .  No drainage , no surrounding erythema    ED Course  Procedures (including critical care time)  Labs Reviewed - No data to display No results found.      MDM    INCISION AND DRAINAGE Performed by: Maxwell Caul. Consent: Verbal consent obtained.  Risks and benefits: risks, benefits and alternatives were discussed Type: abscess  Body area:left axilla Anesthesia: local infiltration  Local anesthetic: lidocaine 1% w/o epinephrine  Anesthetic total: 3 ml  Complexity: complex Blunt dissection to break up loculations  Drainage: purulent  Drainage amount: small Packing material: 1/4 in iodoform gauze  Patient tolerance: Patient tolerated the procedure well with no immediate complications.     Two abscesses to the left axilla.  Will start doxy and Lortab elixir for pain.  Pt agrees to return here in 2 days for recheck and packing removal.    The patient appears reasonably screened  and/or stabilized for discharge and I doubt any other medical condition or other [facility] requiring further screening, evaluation, or treatment in the ED at this time prior to discharge.     Jahzion Brogden L. Velia Pamer, [place] 08/05/12 1542

## 2012-08-03 NOTE — ED Notes (Signed)
Pt c/o redness and swelling under left arm x 3 or 4 days.  Reports wife has history of staph infection.

## 2012-08-03 NOTE — ED Notes (Signed)
Dressing applied. Pt tolerated well.

## 2012-08-05 ENCOUNTER — Emergency Department (HOSPITAL_COMMUNITY)
Admission: EM | Admit: 2012-08-05 | Discharge: 2012-08-05 | Disposition: A | Discharge status: Home / Self Care | Payer: Medicare HMO | Attending: Emergency Medicine | Admitting: Emergency Medicine

## 2012-08-05 ENCOUNTER — Encounter (HOSPITAL_COMMUNITY): Payer: Self-pay | Admitting: *Deleted

## 2012-08-05 DIAGNOSIS — Z5189 Encounter for other specified aftercare: Secondary | ICD-10-CM

## 2012-08-05 DIAGNOSIS — F17200 Nicotine dependence, unspecified, uncomplicated: Secondary | ICD-10-CM | POA: Insufficient documentation

## 2012-08-05 DIAGNOSIS — I10 Essential (primary) hypertension: Secondary | ICD-10-CM | POA: Insufficient documentation

## 2012-08-05 DIAGNOSIS — F411 Generalized anxiety disorder: Secondary | ICD-10-CM | POA: Insufficient documentation

## 2012-08-05 DIAGNOSIS — IMO0002 Reserved for concepts with insufficient information to code with codable children: Secondary | ICD-10-CM | POA: Insufficient documentation

## 2012-08-05 NOTE — ED Notes (Signed)
Wound recheck lt axilla I and D

## 2012-08-05 NOTE — ED Notes (Signed)
Rick PA in with pt at present time,

## 2012-08-05 NOTE — ED Provider Notes (Signed)
History     CSN: [id number]  Arrival date & time 08/05/12  1400   First MD Initiated Contact with Patient 08/05/12 1454      Chief Complaint  Patient presents with  . Wound Check    (Consider location/radiation/quality/duration/timing/severity/associated sxs/prior treatment) HPI Comments: Taking doxycycline.  No fever.  Patient is a 66 y.o. male presenting with wound check. The history is provided by the patient. No language interpreter was used.  Wound Check  He was treated in the ED 2 to 3 days ago. Previous treatment in the ED includes I&D of abscess. Treatments since wound repair include oral antibiotics. His temperature was unmeasured prior to arrival. There has been no drainage from the wound. The redness has improved. The swelling has improved. The pain has improved.    Past Medical History  Diagnosis Date  . Brain aneurysm   . Hypertension   . Anxiety     Past Surgical History  Procedure Date  . Brain surgery     History reviewed. No pertinent family history.  History  Substance Use Topics  . Smoking status: Current Everyday Smoker  . Smokeless tobacco: Not on file  . Alcohol Use: No      Review of Systems  Constitutional: Negative for fever and chills.  Skin:       Abscess   All other systems reviewed and are negative.    Allergies  Cephalexin  Home Medications   Current Outpatient Rx  Name Route Sig Dispense Refill  . ASPIRIN EC 325 MG PO TBEC Oral Take 325 mg by mouth daily.    . BC HEADACHE POWDER PO Oral Take 1 packet by mouth daily as needed. For headache    . ATORVASTATIN CALCIUM 20 MG PO TABS Oral Take 20 mg by mouth daily.    Marland Kitchen DOXYCYCLINE HYCLATE 50 MG PO CAPS Oral Take 50 mg by mouth 2 (two) times daily. For 10 days. Started 9/7    . HYDROCODONE-ACETAMINOPHEN 7.5-500 MG/15ML PO SOLN Oral Take 10 mLs by mouth every 4 (four) hours as needed for pain. 120 mL 0  . LISINOPRIL 10 MG PO TABS Oral Take 10 mg by mouth daily.    Marland Kitchen LORAZEPAM 1  MG PO TABS Oral Take 1 mg by mouth 2 (two) times daily as needed. For nerves      BP 172/98  Pulse 87  Temp 97.9 F (36.6 C) (Oral)  Resp 18  Ht 5\' 9"  (1.753 m)  Wt 223 lb 14.4 oz (101.56 kg)  BMI 33.06 kg/m2  SpO2 99%  Physical Exam  Nursing note and vitals reviewed. Constitutional: He is oriented to person, place, and time. He appears well-developed and well-nourished.  HENT:  Head: Normocephalic and atraumatic.  Eyes: EOM are normal.  Neck: Normal range of motion.  Cardiovascular: Normal rate, regular rhythm, normal heart sounds and intact distal pulses.   Pulmonary/Chest: Effort normal and breath sounds normal. No respiratory distress.  Abdominal: Soft. He exhibits no distension. There is no tenderness.  Musculoskeletal: Normal range of motion.  Neurological: He is alert and oriented to person, place, and time.  Skin: Skin is warm and dry.       Well-healing abscess to L axilla.  Mild PT.  No visible drainage.  i removed ~ 2 inches of packing.    Psychiatric: He has a normal mood and affect. Judgment normal.    ED Course  Procedures (including critical care time)  Labs Reviewed - No data to display No  results found.   1. Wound check, abscess       MDM  Contin abx Remove 2 inches of packing QD Warm compresses.  Return prn         Evalina Field, [place] 08/15/12 1359

## 2012-08-08 NOTE — ED Provider Notes (Signed)
Medical screening examination/treatment/procedure(s) were performed by non-physician practitioner and as supervising physician I was immediately available for consultation/collaboration.   Les Longmore L Nathanyl Andujo, MD 08/08/12 1030 

## 2012-08-11 NOTE — ED Provider Notes (Signed)
History     CSN: [id number]  Arrival date & time 08/05/12  1400   First MD Initiated Contact with Patient 08/05/12 1454      Chief Complaint  Patient presents with  . Wound Check    (Consider location/radiation/quality/duration/timing/severity/associated sxs/prior treatment) HPI  Past Medical History  Diagnosis Date  . Brain aneurysm   . Hypertension   . Anxiety     Past Surgical History  Procedure Date  . Brain surgery     History reviewed. No pertinent family history.  History  Substance Use Topics  . Smoking status: Current Every Day Smoker  . Smokeless tobacco: Not on file  . Alcohol Use: No      Review of Systems  Allergies  Cephalexin  Home Medications   Current Outpatient Rx  Name Route Sig Dispense Refill  . ASPIRIN EC 325 MG PO TBEC Oral Take 325 mg by mouth daily.    . BC HEADACHE POWDER PO Oral Take 1 packet by mouth daily as needed. For headache    . ATORVASTATIN CALCIUM 20 MG PO TABS Oral Take 20 mg by mouth daily.    Marland Kitchen DOXYCYCLINE HYCLATE 50 MG PO CAPS Oral Take 50 mg by mouth 2 (two) times daily. For 10 days. Started 9/7    . HYDROCODONE-ACETAMINOPHEN 7.5-500 MG/15ML PO SOLN Oral Take 10 mLs by mouth every 4 (four) hours as needed for pain. 120 mL 0  . LISINOPRIL 10 MG PO TABS Oral Take 10 mg by mouth daily.    Marland Kitchen LORAZEPAM 1 MG PO TABS Oral Take 1 mg by mouth 2 (two) times daily as needed. For nerves      BP 172/98  Pulse 87  Temp 97.9 F (36.6 C) (Oral)  Resp 18  Ht 5\' 9"  (1.753 m)  Wt 223 lb 14.4 oz (101.56 kg)  BMI 33.06 kg/m2  SpO2 99%  Physical Exam  ED Course  Procedures (including critical care time)  Labs Reviewed - No data to display No results found.   1. Wound check, abscess       MDM  Medical screening examination/treatment/procedure(s) were performed by non-physician practitioner and as supervising physician I was immediately available for consultation/collaboration.        Donnetta Hutching, MD 08/11/12  1151

## 2012-08-16 NOTE — ED Provider Notes (Signed)
Medical screening examination/treatment/procedure(s) were performed by non-physician practitioner and as supervising physician I was immediately available for consultation/collaboration.  Hareem Surowiec, MD 08/16/12 1902 

## 2012-09-20 ENCOUNTER — Ambulatory Visit (HOSPITAL_COMMUNITY)
Admission: RE | Admit: 2012-09-20 | Discharge: 2012-09-20 | Disposition: A | Discharge status: Home / Self Care | Payer: Medicare HMO | Source: Ambulatory Visit | Attending: Family Medicine | Admitting: Family Medicine

## 2012-09-20 ENCOUNTER — Other Ambulatory Visit (HOSPITAL_COMMUNITY): Payer: Self-pay | Admitting: Family Medicine

## 2012-09-20 DIAGNOSIS — F17200 Nicotine dependence, unspecified, uncomplicated: Secondary | ICD-10-CM | POA: Insufficient documentation

## 2012-09-20 DIAGNOSIS — J4489 Other specified chronic obstructive pulmonary disease: Secondary | ICD-10-CM | POA: Insufficient documentation

## 2012-09-20 DIAGNOSIS — J449 Chronic obstructive pulmonary disease, unspecified: Secondary | ICD-10-CM | POA: Insufficient documentation

## 2013-04-15 ENCOUNTER — Telehealth (HOSPITAL_COMMUNITY): Payer: Self-pay | Admitting: Interventional Radiology

## 2013-04-23 ENCOUNTER — Other Ambulatory Visit (HOSPITAL_COMMUNITY): Payer: Self-pay | Admitting: Interventional Radiology

## 2013-04-23 ENCOUNTER — Other Ambulatory Visit: Payer: Self-pay | Admitting: Radiology

## 2013-04-23 DIAGNOSIS — I729 Aneurysm of unspecified site: Secondary | ICD-10-CM

## 2013-04-24 ENCOUNTER — Other Ambulatory Visit (HOSPITAL_COMMUNITY): Payer: Self-pay | Admitting: Interventional Radiology

## 2013-04-24 ENCOUNTER — Encounter (HOSPITAL_COMMUNITY): Payer: Self-pay | Admitting: Pharmacy Technician

## 2013-04-24 ENCOUNTER — Ambulatory Visit (HOSPITAL_COMMUNITY)
Admission: RE | Admit: 2013-04-24 | Discharge: 2013-04-24 | Disposition: A | Discharge status: Home / Self Care | Payer: Medicare HMO | Source: Ambulatory Visit | Attending: Interventional Radiology | Admitting: Interventional Radiology

## 2013-04-24 ENCOUNTER — Encounter (HOSPITAL_COMMUNITY): Payer: Self-pay

## 2013-04-24 DIAGNOSIS — I729 Aneurysm of unspecified site: Secondary | ICD-10-CM

## 2013-04-24 DIAGNOSIS — M542 Cervicalgia: Secondary | ICD-10-CM | POA: Insufficient documentation

## 2013-04-24 DIAGNOSIS — I671 Cerebral aneurysm, nonruptured: Secondary | ICD-10-CM | POA: Insufficient documentation

## 2013-04-24 DIAGNOSIS — R51 Headache: Secondary | ICD-10-CM | POA: Insufficient documentation

## 2013-04-24 DIAGNOSIS — F411 Generalized anxiety disorder: Secondary | ICD-10-CM | POA: Insufficient documentation

## 2013-04-24 DIAGNOSIS — I10 Essential (primary) hypertension: Secondary | ICD-10-CM | POA: Insufficient documentation

## 2013-04-24 DIAGNOSIS — Z881 Allergy status to other antibiotic agents status: Secondary | ICD-10-CM | POA: Insufficient documentation

## 2013-04-24 DIAGNOSIS — F17200 Nicotine dependence, unspecified, uncomplicated: Secondary | ICD-10-CM | POA: Insufficient documentation

## 2013-04-24 LAB — BASIC METABOLIC PANEL
Chloride: 102 mEq/L (ref 96–112)
Creatinine, Ser: 0.82 mg/dL (ref 0.50–1.35)
GFR calc Af Amer: >90 mL/min (ref >90)
Sodium: 138 mEq/L (ref 135–145)

## 2013-04-24 LAB — CBC WITH DIFFERENTIAL/PLATELET
Basophils Absolute: 0.1 10*3/uL (ref 0.0–0.1)
HCT: 42.8 % (ref 39.0–52.0)
Lymphocytes Relative: 26 % (ref 12–46)
Neutro Abs: 5.3 10*3/uL (ref 1.7–7.7)
Platelets: 220 10*3/uL (ref 150–400)
RBC: 4.72 MIL/uL (ref 4.22–5.81)
RDW: 15.3 % (ref 11.5–15.5)
WBC: 9.2 10*3/uL (ref 4.0–10.5)

## 2013-04-24 LAB — PROTIME-INR: Prothrombin Time: 12.3 seconds (ref 11.6–15.2)

## 2013-04-24 LAB — APTT: aPTT: 30 seconds (ref 24–37)

## 2013-04-24 MED ORDER — IOHEXOL 300 MG/ML  SOLN
150.0000 mL | Freq: Once | INTRAMUSCULAR | Status: AC | PRN
  Administered 2013-04-24: 65 mL via INTRA_ARTERIAL

## 2013-04-24 MED ORDER — MIDAZOLAM HCL 2 MG/2ML IJ SOLN
INTRAMUSCULAR | Status: AC | PRN
  Administered 2013-04-24: 1 mg via INTRAVENOUS

## 2013-04-24 MED ORDER — HEPARIN SODIUM (PORCINE) 1000 UNIT/ML IJ SOLN
INTRAMUSCULAR | Status: AC | PRN
  Administered 2013-04-24: 500 [IU] via INTRAVENOUS

## 2013-04-24 MED ORDER — MIDAZOLAM HCL 2 MG/2ML IJ SOLN
INTRAMUSCULAR | Status: AC
  Filled 2013-04-24: qty 4

## 2013-04-24 MED ORDER — FENTANYL CITRATE 0.05 MG/ML IJ SOLN
INTRAMUSCULAR | Status: AC | PRN
  Administered 2013-04-24: 25 ug via INTRAVENOUS

## 2013-04-24 MED ORDER — FENTANYL CITRATE 0.05 MG/ML IJ SOLN
INTRAMUSCULAR | Status: AC
  Filled 2013-04-24: qty 2

## 2013-04-24 MED ORDER — SODIUM CHLORIDE 0.9 % IV SOLN
Freq: Once | INTRAVENOUS | Status: AC
  Administered 2013-04-24: 10:00:00 via INTRAVENOUS

## 2013-04-24 MED ORDER — SODIUM CHLORIDE 0.9 % IV SOLN: INTRAVENOUS | Status: AC

## 2013-04-24 NOTE — Procedures (Signed)
S/P bilateral common carotid and lt vertebral arteriograms. Rt CFa approach. Findings . 1. Obliterated Rt MCa aneurysm.No coil compaction or recanalization . 2. Approx 55 % stenosis of Lt vert origin.

## 2013-04-24 NOTE — Progress Notes (Signed)
Assumed care of pt from Margaret Pyle, RN. Assessment documented.

## 2013-04-24 NOTE — H&P (Signed)
Brett Elliott is an 67 y.o. male.   Chief Complaint: R middle cerebral artery aneurysm coiling 2007 Stable in all follow ups Continues to smoke Continues with headaches and new rt neck pain x 2-3 months Scheduled now for follow up cerebral arteriogram  HPI: R MCA aneurysm; HTN; smoker  Past Medical History  Diagnosis Date  . Brain aneurysm   . Hypertension   . Anxiety     Past Surgical History  Procedure Laterality Date  . Brain surgery      No family history on file. Social History:  reports that he has been smoking.  He does not have any smokeless tobacco history on file. He reports that he does not drink alcohol or use illicit drugs.  Allergies:  Allergies  Allergen Reactions  . Cephalexin Rash     (Not in a hospital admission)  Results for orders placed during the hospital encounter of 04/24/13 (from the past 48 hour(s))  APTT     Status: None   Collection Time    04/24/13  9:56 AM      Result Value Range   aPTT 30  24 - 37 seconds  CBC WITH DIFFERENTIAL     Status: Abnormal   Collection Time    04/24/13  9:56 AM      Result Value Range   WBC 9.2  4.0 - 10.5 K/uL   RBC 4.72  4.22 - 5.81 MIL/uL   Hemoglobin 14.8  13.0 - 17.0 g/dL   HCT 16.1  09.6 - 04.5 %   MCV 90.7  78.0 - 100.0 fL   MCH 31.4  26.0 - 34.0 pg   MCHC 34.6  30.0 - 36.0 g/dL   RDW 40.9  81.1 - 91.4 %   Platelets 220  150 - 400 K/uL   Neutrophils Relative % 57  43 - 77 %   Neutro Abs 5.3  1.7 - 7.7 K/uL   Lymphocytes Relative 26  12 - 46 %   Lymphs Abs 2.4  0.7 - 4.0 K/uL   Monocytes Relative 10  3 - 12 %   Monocytes Absolute 0.9  0.1 - 1.0 K/uL   Eosinophils Relative 6 (*) 0 - 5 %   Eosinophils Absolute 0.6  0.0 - 0.7 K/uL   Basophils Relative 1  0 - 1 %   Basophils Absolute 0.1  0.0 - 0.1 K/uL  PROTIME-INR     Status: None   Collection Time    04/24/13  9:56 AM      Result Value Range   Prothrombin Time 12.3  11.6 - 15.2 seconds   INR 0.92  0.00 - 1.49   No results  found.  Review of Systems  Constitutional: Negative for fever.  HENT: Positive for neck pain.   Respiratory: Positive for sputum production and wheezing.   Cardiovascular: Negative for chest pain.  Gastrointestinal: Negative for nausea, vomiting and abdominal pain.  Neurological: Positive for headaches. Negative for dizziness and weakness.    Blood pressure 137/70, pulse 63, temperature 97.3 F (36.3 C), temperature source Oral, resp. rate 18, height 5\' 6"  (1.676 m), weight 210 lb (95.255 kg), SpO2 92.00%. Physical Exam  Constitutional: He is oriented to person, place, and time. He appears well-developed.  Cardiovascular: Normal rate, regular rhythm and normal heart sounds.   No murmur heard. Respiratory: He has wheezes.  GI: Soft. Bowel sounds are normal. There is no tenderness.  Musculoskeletal: Normal range of motion.  Neurological: He is alert and  oriented to person, place, and time.  Skin: Skin is warm.  Psychiatric: He has a normal mood and affect. His behavior is normal. Judgment and thought content normal.     Assessment/Plan R MCA aneurysm coiling 2007 Stable every f/u +H/As; + rt neck pain Scheduled for f/u cerebral arteriogram Still smoking Pt aware of procedure benefits and risks and agreeable to proceed Consent signed and in chart  Tonnya Garbett A 04/24/2013, 10:34 AM

## 2013-04-24 NOTE — ED Notes (Signed)
Pt complains of pain to right hip and groin.  Rating 2 on 1-10 pain scales.  Says he thinks he pulled a muscle moving a love seat a few weeks ago. He says pain is unchanged from time it started.  He says he plans to follow up with his PCP for this

## 2013-04-24 NOTE — ED Notes (Signed)
Patient denies any new or additional pain and is resting comfortably. He says hip groin pain remains at 2 on 1-10 scale

## 2014-04-29 ENCOUNTER — Other Ambulatory Visit (HOSPITAL_COMMUNITY): Payer: Self-pay | Admitting: Interventional Radiology

## 2014-04-29 DIAGNOSIS — I729 Aneurysm of unspecified site: Secondary | ICD-10-CM

## 2014-05-01 ENCOUNTER — Telehealth (HOSPITAL_COMMUNITY): Payer: Self-pay | Admitting: Interventional Radiology

## 2014-05-01 NOTE — Telephone Encounter (Signed)
Called pt, left VM for him to call to schedule his 1 yr f/u CTA. JMichaux

## 2014-05-22 ENCOUNTER — Ambulatory Visit (HOSPITAL_COMMUNITY)
Admission: RE | Admit: 2014-05-22 | Discharge: 2014-05-22 | Disposition: A | Discharge status: Home / Self Care | Payer: Medicare HMO | Source: Ambulatory Visit | Attending: Interventional Radiology | Admitting: Interventional Radiology

## 2014-05-22 ENCOUNTER — Ambulatory Visit (HOSPITAL_COMMUNITY): Admission: RE | Admit: 2014-05-22 | Payer: Medicare HMO | Source: Ambulatory Visit

## 2014-05-22 DIAGNOSIS — Z09 Encounter for follow-up examination after completed treatment for conditions other than malignant neoplasm: Secondary | ICD-10-CM | POA: Insufficient documentation

## 2014-05-22 DIAGNOSIS — I729 Aneurysm of unspecified site: Secondary | ICD-10-CM

## 2014-05-22 LAB — POCT I-STAT CREATININE: CREATININE: 1 mg/dL (ref 0.50–1.35)

## 2014-05-22 MED ORDER — IOHEXOL 350 MG/ML SOLN
80.0000 mL | Freq: Once | INTRAVENOUS | Status: AC | PRN
  Administered 2014-05-22: 80 mL via INTRAVENOUS

## 2014-05-26 ENCOUNTER — Telehealth (HOSPITAL_COMMUNITY): Payer: Self-pay | Admitting: Interventional Radiology

## 2014-05-26 NOTE — Telephone Encounter (Signed)
Called pt, left VM that Deveshwar suggests a follow-up MRI/MRA brain in 1 year's time. JMichaux

## 2015-05-19 ENCOUNTER — Telehealth (HOSPITAL_COMMUNITY): Payer: Self-pay | Admitting: Interventional Radiology

## 2015-09-23 ENCOUNTER — Other Ambulatory Visit (HOSPITAL_COMMUNITY): Payer: Self-pay | Admitting: Interventional Radiology

## 2015-09-23 ENCOUNTER — Telehealth (HOSPITAL_COMMUNITY): Payer: Self-pay

## 2015-09-23 DIAGNOSIS — I729 Aneurysm of unspecified site: Secondary | ICD-10-CM

## 2015-09-23 NOTE — Telephone Encounter (Signed)
Called to check on status of pt's insurance and see if pt still wanted to schedule MRI, left message for pt to call back. AW

## 2015-10-11 ENCOUNTER — Ambulatory Visit (HOSPITAL_COMMUNITY)
Admission: RE | Admit: 2015-10-11 | Discharge: 2015-10-11 | Disposition: A | Discharge status: Home / Self Care | Payer: Medicare HMO | Source: Ambulatory Visit | Attending: Interventional Radiology | Admitting: Interventional Radiology

## 2015-10-11 DIAGNOSIS — I729 Aneurysm of unspecified site: Secondary | ICD-10-CM

## 2015-11-23 ENCOUNTER — Other Ambulatory Visit (HOSPITAL_COMMUNITY): Payer: Self-pay | Admitting: Interventional Radiology

## 2015-11-23 DIAGNOSIS — I729 Aneurysm of unspecified site: Secondary | ICD-10-CM

## 2015-12-07 ENCOUNTER — Encounter (HOSPITAL_COMMUNITY): Payer: Self-pay

## 2015-12-07 ENCOUNTER — Ambulatory Visit (HOSPITAL_COMMUNITY)
Admission: RE | Admit: 2015-12-07 | Discharge: 2015-12-07 | Disposition: A | Discharge status: Home / Self Care | Payer: Medicare HMO | Source: Ambulatory Visit | Attending: Interventional Radiology | Admitting: Interventional Radiology

## 2015-12-07 ENCOUNTER — Ambulatory Visit (HOSPITAL_COMMUNITY): Payer: Medicare HMO

## 2015-12-07 DIAGNOSIS — I671 Cerebral aneurysm, nonruptured: Secondary | ICD-10-CM | POA: Insufficient documentation

## 2015-12-07 DIAGNOSIS — I729 Aneurysm of unspecified site: Secondary | ICD-10-CM | POA: Diagnosis not present

## 2015-12-07 DIAGNOSIS — R51 Headache: Secondary | ICD-10-CM | POA: Insufficient documentation

## 2015-12-07 DIAGNOSIS — I6502 Occlusion and stenosis of left vertebral artery: Secondary | ICD-10-CM | POA: Diagnosis not present

## 2015-12-07 DIAGNOSIS — H532 Diplopia: Secondary | ICD-10-CM | POA: Diagnosis not present

## 2015-12-07 LAB — POCT I-STAT CREATININE: CREATININE: 0.9 mg/dL (ref 0.61–1.24)

## 2015-12-07 MED ORDER — IOHEXOL 350 MG/ML SOLN
50.0000 mL | Freq: Once | INTRAVENOUS | Status: AC | PRN
  Administered 2015-12-07: 50 mL via INTRAVENOUS

## 2015-12-14 ENCOUNTER — Telehealth (HOSPITAL_COMMUNITY): Payer: Self-pay

## 2015-12-14 NOTE — Telephone Encounter (Signed)
Left a message for pt to follow up in 1 year with CTA per Dr. Corliss Skains. AW

## 2016-11-07 ENCOUNTER — Telehealth (HOSPITAL_COMMUNITY): Payer: Self-pay

## 2016-11-07 NOTE — Telephone Encounter (Signed)
Called to schedule 1 yr f/u ct, left message for pt to return call. AW

## 2016-11-09 ENCOUNTER — Other Ambulatory Visit (HOSPITAL_COMMUNITY): Payer: Self-pay | Admitting: Interventional Radiology

## 2016-11-09 DIAGNOSIS — I729 Aneurysm of unspecified site: Secondary | ICD-10-CM

## 2016-11-22 ENCOUNTER — Ambulatory Visit (HOSPITAL_COMMUNITY): Payer: Medicare HMO

## 2016-11-22 ENCOUNTER — Ambulatory Visit (HOSPITAL_COMMUNITY)
Admission: RE | Admit: 2016-11-22 | Discharge: 2016-11-22 | Disposition: A | Discharge status: Home / Self Care | Payer: Medicare HMO | Source: Ambulatory Visit | Attending: Interventional Radiology | Admitting: Interventional Radiology

## 2016-11-22 ENCOUNTER — Encounter (HOSPITAL_COMMUNITY): Payer: Self-pay

## 2016-11-22 DIAGNOSIS — I671 Cerebral aneurysm, nonruptured: Secondary | ICD-10-CM | POA: Insufficient documentation

## 2016-11-22 DIAGNOSIS — I729 Aneurysm of unspecified site: Secondary | ICD-10-CM | POA: Diagnosis present

## 2016-11-22 DIAGNOSIS — I700 Atherosclerosis of aorta: Secondary | ICD-10-CM | POA: Diagnosis not present

## 2016-11-22 LAB — POCT I-STAT CREATININE: CREATININE: 0.8 mg/dL (ref 0.61–1.24)

## 2016-11-22 MED ORDER — IOPAMIDOL (ISOVUE-370) INJECTION 76%
INTRAVENOUS | Status: AC
  Administered 2016-11-22: 50 mL
  Filled 2016-11-22: qty 50

## 2016-12-05 ENCOUNTER — Telehealth (HOSPITAL_COMMUNITY): Payer: Self-pay

## 2016-12-05 NOTE — Telephone Encounter (Signed)
Called, no answer, no vm. AW 

## 2017-03-08 ENCOUNTER — Other Ambulatory Visit (HOSPITAL_COMMUNITY): Payer: Self-pay | Admitting: Psychiatry

## 2017-03-10 ENCOUNTER — Other Ambulatory Visit (HOSPITAL_COMMUNITY): Payer: Self-pay | Admitting: Psychiatry

## 2017-03-29 DIAGNOSIS — I10 Essential (primary) hypertension: Secondary | ICD-10-CM | POA: Diagnosis not present

## 2017-03-29 DIAGNOSIS — F419 Anxiety disorder, unspecified: Secondary | ICD-10-CM | POA: Diagnosis not present

## 2017-03-29 DIAGNOSIS — F3289 Other specified depressive episodes: Secondary | ICD-10-CM | POA: Diagnosis not present

## 2017-03-29 DIAGNOSIS — F1120 Opioid dependence, uncomplicated: Secondary | ICD-10-CM | POA: Diagnosis not present

## 2017-03-29 DIAGNOSIS — E782 Mixed hyperlipidemia: Secondary | ICD-10-CM | POA: Diagnosis not present

## 2017-03-29 DIAGNOSIS — R739 Hyperglycemia, unspecified: Secondary | ICD-10-CM | POA: Diagnosis not present

## 2017-04-24 DIAGNOSIS — I10 Essential (primary) hypertension: Secondary | ICD-10-CM | POA: Diagnosis not present

## 2017-04-24 DIAGNOSIS — R739 Hyperglycemia, unspecified: Secondary | ICD-10-CM | POA: Diagnosis not present

## 2017-04-24 DIAGNOSIS — E782 Mixed hyperlipidemia: Secondary | ICD-10-CM | POA: Diagnosis not present

## 2017-04-27 DIAGNOSIS — F419 Anxiety disorder, unspecified: Secondary | ICD-10-CM | POA: Diagnosis not present

## 2017-04-27 DIAGNOSIS — W57XXXA Bitten or stung by nonvenomous insect and other nonvenomous arthropods, initial encounter: Secondary | ICD-10-CM | POA: Diagnosis not present

## 2017-04-27 DIAGNOSIS — E782 Mixed hyperlipidemia: Secondary | ICD-10-CM | POA: Diagnosis not present

## 2017-04-27 DIAGNOSIS — I10 Essential (primary) hypertension: Secondary | ICD-10-CM | POA: Diagnosis not present

## 2017-04-27 DIAGNOSIS — F3289 Other specified depressive episodes: Secondary | ICD-10-CM | POA: Diagnosis not present

## 2017-05-22 ENCOUNTER — Emergency Department (HOSPITAL_COMMUNITY): Payer: Medicare HMO

## 2017-05-22 ENCOUNTER — Encounter (HOSPITAL_COMMUNITY): Payer: Self-pay | Admitting: *Deleted

## 2017-05-22 ENCOUNTER — Emergency Department (HOSPITAL_COMMUNITY)
Admission: EM | Admit: 2017-05-22 | Discharge: 2017-05-22 | Disposition: A | Discharge status: Other Acute Inpatient Hospital | Payer: Medicare HMO | Attending: Emergency Medicine | Admitting: Emergency Medicine

## 2017-05-22 DIAGNOSIS — E784 Other hyperlipidemia: Secondary | ICD-10-CM | POA: Diagnosis not present

## 2017-05-22 DIAGNOSIS — Z7982 Long term (current) use of aspirin: Secondary | ICD-10-CM | POA: Insufficient documentation

## 2017-05-22 DIAGNOSIS — Z79899 Other long term (current) drug therapy: Secondary | ICD-10-CM | POA: Insufficient documentation

## 2017-05-22 DIAGNOSIS — F17210 Nicotine dependence, cigarettes, uncomplicated: Secondary | ICD-10-CM | POA: Insufficient documentation

## 2017-05-22 DIAGNOSIS — F322 Major depressive disorder, single episode, severe without psychotic features: Secondary | ICD-10-CM | POA: Diagnosis not present

## 2017-05-22 DIAGNOSIS — F339 Major depressive disorder, recurrent, unspecified: Secondary | ICD-10-CM | POA: Insufficient documentation

## 2017-05-22 DIAGNOSIS — I10 Essential (primary) hypertension: Secondary | ICD-10-CM | POA: Insufficient documentation

## 2017-05-22 DIAGNOSIS — Z888 Allergy status to other drugs, medicaments and biological substances status: Secondary | ICD-10-CM | POA: Diagnosis not present

## 2017-05-22 DIAGNOSIS — R45851 Suicidal ideations: Secondary | ICD-10-CM | POA: Diagnosis not present

## 2017-05-22 DIAGNOSIS — R531 Weakness: Secondary | ICD-10-CM | POA: Diagnosis present

## 2017-05-22 DIAGNOSIS — R079 Chest pain, unspecified: Secondary | ICD-10-CM | POA: Diagnosis not present

## 2017-05-22 DIAGNOSIS — F329 Major depressive disorder, single episode, unspecified: Secondary | ICD-10-CM | POA: Insufficient documentation

## 2017-05-22 DIAGNOSIS — E785 Hyperlipidemia, unspecified: Secondary | ICD-10-CM | POA: Diagnosis not present

## 2017-05-22 LAB — CBC WITH DIFFERENTIAL/PLATELET
BASOS PCT: 0 %
Basophils Absolute: 0 10*3/uL (ref 0.0–0.1)
EOS ABS: 0.2 10*3/uL (ref 0.0–0.7)
EOS PCT: 1 %
HCT: 39.1 % (ref 39.0–52.0)
HEMOGLOBIN: 13.4 g/dL (ref 13.0–17.0)
LYMPHS ABS: 2.3 10*3/uL (ref 0.7–4.0)
Lymphocytes Relative: 22 %
MCH: 31.2 pg (ref 26.0–34.0)
MCHC: 34.3 g/dL (ref 30.0–36.0)
MCV: 91.1 fL (ref 78.0–100.0)
MONOS PCT: 12 %
Monocytes Absolute: 1.3 10*3/uL — ABNORMAL HIGH (ref 0.1–1.0)
NEUTROS PCT: 65 %
Neutro Abs: 6.7 10*3/uL (ref 1.7–7.7)
Platelets: 275 10*3/uL (ref 150–400)
RBC: 4.29 MIL/uL (ref 4.22–5.81)
RDW: 15.6 % — ABNORMAL HIGH (ref 11.5–15.5)
WBC: 10.5 10*3/uL (ref 4.0–10.5)

## 2017-05-22 LAB — URINALYSIS, ROUTINE W REFLEX MICROSCOPIC
BILIRUBIN URINE: NEGATIVE
Glucose, UA: NEGATIVE mg/dL
KETONES UR: NEGATIVE mg/dL
Leukocytes, UA: NEGATIVE
Nitrite: NEGATIVE
Protein, ur: 30 mg/dL — AB
Specific Gravity, Urine: 1.026 (ref 1.005–1.030)
pH: 5 (ref 5.0–8.0)

## 2017-05-22 LAB — I-STAT TROPONIN, ED: Troponin i, poc: 0 ng/mL (ref 0.00–0.08)

## 2017-05-22 LAB — RAPID URINE DRUG SCREEN, HOSP PERFORMED
Amphetamines: NOT DETECTED
Barbiturates: NOT DETECTED
Benzodiazepines: NOT DETECTED
Cocaine: NOT DETECTED
OPIATES: NOT DETECTED
TETRAHYDROCANNABINOL: NOT DETECTED

## 2017-05-22 LAB — COMPREHENSIVE METABOLIC PANEL
ALK PHOS: 84 U/L (ref 38–126)
ALT: 18 U/L (ref 17–63)
AST: 32 U/L (ref 15–41)
Albumin: 4 g/dL (ref 3.5–5.0)
Anion gap: 10 (ref 5–15)
BUN: 17 mg/dL (ref 6–20)
CALCIUM: 9.2 mg/dL (ref 8.9–10.3)
CHLORIDE: 103 mmol/L (ref 101–111)
CO2: 27 mmol/L (ref 22–32)
CREATININE: 0.97 mg/dL (ref 0.61–1.24)
GFR calc non Af Amer: >60 mL/min (ref >60)
Glucose, Bld: 141 mg/dL — ABNORMAL HIGH (ref 65–99)
Potassium: 3.4 mmol/L — ABNORMAL LOW (ref 3.5–5.1)
SODIUM: 140 mmol/L (ref 135–145)
Total Bilirubin: 0.5 mg/dL (ref 0.3–1.2)
Total Protein: 7 g/dL (ref 6.5–8.1)

## 2017-05-22 LAB — ETHANOL: Alcohol, Ethyl (B): <5 mg/dL (ref <5)

## 2017-05-22 MED ORDER — LORAZEPAM 1 MG PO TABS
1.0000 mg | ORAL_TABLET | Freq: Two times a day (BID) | ORAL | Status: DC | PRN
  Administered 2017-05-22: 1 mg via ORAL
  Filled 2017-05-22: qty 1

## 2017-05-22 MED ORDER — ZOLPIDEM TARTRATE 5 MG PO TABS: 5.0000 mg | ORAL_TABLET | Freq: Every evening | ORAL | Status: DC | PRN

## 2017-05-22 MED ORDER — IBUPROFEN 400 MG PO TABS: 600.0000 mg | ORAL_TABLET | Freq: Three times a day (TID) | ORAL | Status: DC | PRN

## 2017-05-22 MED ORDER — ATORVASTATIN CALCIUM 20 MG PO TABS
20.0000 mg | ORAL_TABLET | Freq: Every day | ORAL | Status: DC
  Administered 2017-05-22: 20 mg via ORAL
  Filled 2017-05-22: qty 1

## 2017-05-22 MED ORDER — MELATONIN 5 MG/15ML PO LIQD: 15.0000 mL | Freq: Every day | ORAL | Status: DC

## 2017-05-22 MED ORDER — LISINOPRIL 10 MG PO TABS
40.0000 mg | ORAL_TABLET | Freq: Every day | ORAL | Status: DC
  Administered 2017-05-22: 40 mg via ORAL
  Filled 2017-05-22: qty 4

## 2017-05-22 MED ORDER — ONDANSETRON HCL 4 MG PO TABS: 4.0000 mg | ORAL_TABLET | Freq: Three times a day (TID) | ORAL | Status: DC | PRN

## 2017-05-22 MED ORDER — ALUM & MAG HYDROXIDE-SIMETH 200-200-20 MG/5ML PO SUSP: 30.0000 mL | Freq: Four times a day (QID) | ORAL | Status: DC | PRN

## 2017-05-22 MED ORDER — ACETAMINOPHEN 325 MG PO TABS: 650.0000 mg | ORAL_TABLET | ORAL | Status: DC | PRN

## 2017-05-22 MED ORDER — NICOTINE 21 MG/24HR TD PT24
21.0000 mg | MEDICATED_PATCH | Freq: Every day | TRANSDERMAL | Status: DC
  Administered 2017-05-22: 21 mg via TRANSDERMAL
  Filled 2017-05-22: qty 1

## 2017-05-22 MED ORDER — ASPIRIN 81 MG PO CHEW
324.0000 mg | CHEWABLE_TABLET | Freq: Once | ORAL | Status: AC
  Administered 2017-05-22: 324 mg via ORAL
  Filled 2017-05-22: qty 4

## 2017-05-22 MED ORDER — POTASSIUM CHLORIDE CRYS ER 20 MEQ PO TBCR
40.0000 meq | EXTENDED_RELEASE_TABLET | Freq: Once | ORAL | Status: AC
  Administered 2017-05-22: 40 meq via ORAL
  Filled 2017-05-22: qty 2

## 2017-05-22 MED ORDER — LORAZEPAM 1 MG PO TABS
1.0000 mg | ORAL_TABLET | Freq: Once | ORAL | Status: AC
  Administered 2017-05-22: 1 mg via ORAL
  Filled 2017-05-22: qty 1

## 2017-05-22 MED ORDER — DOXYCYCLINE HYCLATE 100 MG PO TABS
50.0000 mg | ORAL_TABLET | Freq: Two times a day (BID) | ORAL | Status: DC
  Administered 2017-05-22: 50 mg via ORAL
  Filled 2017-05-22: qty 1

## 2017-05-22 MED ORDER — ASPIRIN EC 325 MG PO TBEC
325.0000 mg | DELAYED_RELEASE_TABLET | Freq: Every day | ORAL | Status: DC
  Administered 2017-05-22: 325 mg via ORAL
  Filled 2017-05-22: qty 1

## 2017-05-22 NOTE — ED Triage Notes (Signed)
Pt has multiple complaints and states he has been feeling weak for the last couple of days with some chest pain

## 2017-05-22 NOTE — ED Notes (Addendum)
Gave report to [facility]ennifer [facility] at [facility], patient transported to facility via [facility], vital signs stable, no distress noted, ambulating without assistance. [phone]

## 2017-05-22 NOTE — ED Notes (Signed)
Pt ambulated to bathroom with steady gait. 

## 2017-05-22 NOTE — Progress Notes (Signed)
[facility] was accepted [facility]. Writer faxed voluntary consent for patient to sign and fax back to [facility] at [facility] [phone]  Melbourne Abtsatia Paislea Hatton, LCSWA Disposition staff 05/22/2017 6:27 PM
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
## 2017-05-22 NOTE — ED Notes (Signed)
Per Dr. Clayborne DanaMesner- no sitter needed at present. Pt is not SI/HI and is voluntary. Will be placed inpatient for depression. Will reassess as needed or if patient becomes flight risk.

## 2017-05-22 NOTE — ED Provider Notes (Signed)
[facility] Provider Note   CSN: [id number] Arrival date & time: 05/22/17  0302     History   Chief Complaint Chief Complaint  Patient presents with  . Weakness    HPI Brett Elliott is a [age] y.o. male.  The history is provided by the patient and the spouse.  Weakness   He apparently has been having marital difficulties and has been very depressed. He has had difficulty sleeping and is feeling generally weak. He has had some crying spells and decreased energy, but he blames the decreased energy on heat rather than psychiatric problems. He denies suicidal ideation. However, his wife states that he did make comments about wanting to kill himself. He is complaining of being generally weak. Apparently, at one point he did complain of chest pain, but he is denying that currently. He states he has been compliant with his medication for blood pressure and cholesterol. Clinic risk factors include.tobacco abuse, hypertension, hyperlipidemia.  Past Medical History:  Diagnosis Date  . Anxiety   . Brain aneurysm   . Hypertension     There are no active problems to display for this patient.   Past Surgical History:  Procedure Laterality Date  . BRAIN SURGERY    . CATARACT EXTRACTION W/ INTRAOCULAR LENS IMPLANT Bilateral        Home Medications    Prior to Admission medications   Medication Sig Start Date End Date Taking? Authorizing Provider  aspirin EC 325 MG tablet Take 325 mg by mouth daily.    [provider]  Aspirin-Salicylamide-Caffeine (BC HEADACHE POWDER PO) Take 1 packet by mouth daily as needed (For headache.). For headache    [provider]  atorvastatin (LIPITOR) 20 MG tablet Take 20 mg by mouth daily.    [provider]  doxycycline (VIBRAMYCIN) 50 MG capsule Take 50 mg by mouth 2 (two) times daily. For 10 days. Started 9/7    [provider]  lisinopril (PRINIVIL,ZESTRIL) 10 MG tablet Take 10 mg by mouth daily.     [provider]  LORazepam (ATIVAN) 1 MG tablet Take 1 mg by mouth 2 (two) times daily as needed for anxiety. For nerves    [provider]    Family History History reviewed. No pertinent family history.  Social History Social History  Substance Use Topics  . Smoking status: Current Every Day Smoker    Packs/day: 1.50    Types: Cigarettes  . Smokeless tobacco: Never Used  . Alcohol use No     Allergies   Cephalexin   Review of Systems Review of Systems  Neurological: Positive for weakness.  All other systems reviewed and are negative.    Physical Exam Updated Vital Signs BP (!) 171/81 (BP Location: Right Arm)   Pulse 96   Temp 97.5 F (36.4 C) (Oral)   Resp 18   Ht 5\' 8"  (1.727 m)   Wt 89.8 kg (198 lb)   SpO2 95%   BMI 30.11 kg/m   Physical Exam  Nursing note and vitals reviewed.  71 year old male, resting comfortably and in no acute distress. Vital signs are significant for hypertension. Oxygen saturation is 95%, which is normal. Head is normocephalic and atraumatic. PERRLA, EOMI. Oropharynx is clear. Neck is nontender and supple without adenopathy or JVD. Back is nontender and there is no CVA tenderness. Lungs are clear without rales, wheezes, or rhonchi. Chest is nontender. Heart has regular rate and rhythm without murmur. Abdomen is soft, flat,  nontender without masses or hepatosplenomegaly and peristalsis is normoactive. Extremities have no cyanosis or edema, full range of motion is present. Skin is warm and dry without rash. Neurologic: Mental status is normal, cranial nerves are intact, there are no motor or sensory deficits.  ED Treatments / Results  Labs (all labs ordered are listed, but only abnormal results are displayed) Labs Reviewed  COMPREHENSIVE METABOLIC PANEL - Abnormal; Notable for the following:       Result Value   Potassium 3.4 (*)    Glucose, Bld 141 (*)    All other components within normal limits  CBC WITH  DIFFERENTIAL/PLATELET - Abnormal; Notable for the following:    RDW 15.6 (*)    Monocytes Absolute 1.3 (*)    All other components within normal limits  URINALYSIS, ROUTINE W REFLEX MICROSCOPIC - Abnormal; Notable for the following:    APPearance HAZY (*)    Hgb urine dipstick MODERATE (*)    Protein, ur 30 (*)    Bacteria, UA RARE (*)    Squamous Epithelial / LPF 0-5 (*)    All other components within normal limits  RAPID URINE DRUG SCREEN, HOSP PERFORMED  ETHANOL  I-STAT TROPOININ, ED    EKG  EKG Interpretation  Date/Time:  Tuesday May 22 2017 03:25:11 EDT Ventricular Rate:  74 PR Interval:    QRS Duration: 103 QT Interval:  410 QTC Calculation: 455 R Axis:   32 Text Interpretation:  Sinus rhythm Premature ventricular complexes When compared with ECG of 03/20/2008, Premature ventricular complexes are now present Confirmed by Dione Booze ([id number]) on 05/22/2017 3:37:16 AM       Radiology Dg Chest 2 View  Result Date: 05/22/2017 CLINICAL DATA:  Weakness and chest pain for 2 days. EXAM: CHEST  2 VIEW COMPARISON:  09/20/2012 FINDINGS: Unchanged hyperinflation. The lungs are clear. The pulmonary vasculature is normal. There is no pleural effusion. Heart size is normal. Hilar and mediastinal contours are unremarkable and unchanged. IMPRESSION: Hyperinflation.  No consolidation or effusion.  Normal vasculature. Electronically Signed   By: Ellery Plunk M.D.   On: 05/22/2017 04:49    Procedures Procedures (including critical care time)  Medications Ordered in ED Medications  zolpidem (AMBIEN) tablet 5 mg (not administered)  ondansetron (ZOFRAN) tablet 4 mg (not administered)  nicotine (NICODERM CQ - dosed in mg/24 hours) patch 21 mg (not administered)  ibuprofen (ADVIL,MOTRIN) tablet 600 mg (not administered)  alum & mag hydroxide-simeth (MAALOX/MYLANTA) 200-200-20 MG/5ML suspension 30 mL (not administered)  acetaminophen (TYLENOL) tablet 650 mg (not administered)  aspirin  chewable tablet 324 mg (324 mg Oral Given 05/22/17 0334)  potassium chloride SA (K-DUR,KLOR-CON) CR tablet 40 mEq (40 mEq Oral Given 05/22/17 0551)  LORazepam (ATIVAN) tablet 1 mg (1 mg Oral Given 05/22/17 0551)     Initial Impression / Assessment and Plan / ED Course  I have reviewed the triage vital signs and the nursing notes.  Pertinent labs & imaging results that were available during my care of the patient were reviewed by me and considered in my medical decision making (see chart for details).  Major depression. Vague chest pain no which I doubt is significant. ECG shows no acute changes. We'll check screening labs for causes of weakness. Anticipate need for TTS consultation. Old records are reviewed, and he has no relevant past visits.  Screening labs show mild hypokalemia and is given a dose of K-Dur. TTS consultation is pending. Case is signed out to Dr. Erin Hearing.  Final Clinical Impressions(s) /  ED Diagnoses   Final diagnoses:  Reactive depression    New Prescriptions New Prescriptions   No medications on file     Dione Booze, MD 05/22/17 [phone]

## 2017-05-22 NOTE — ED Notes (Addendum)
Pt and his wife states that his left leg is completely numb dr Preston Fleetingglick notified no new orders at this time this just started

## 2017-05-22 NOTE — Progress Notes (Signed)
Patient meets criteria for inpatient treatment. CSW faxed referrals to the following inpatient facilities for review:   [facility], [facility], [facility], [facility], [facility], [facility], [facility], [facility], [facility], [facility].  TTS will continue to seek bed placement.   Baldo DaubJolan Caileen Veracruz MSW, LCSWA CSW Disposition [phone]

## 2017-05-22 NOTE — ED Notes (Signed)
Pt states that he is having trouble in his marriage he can not sleep he has been pacing the floors at night. He is having thoughts of harming himself but does not have the "balls" to do it. The patient's wife is in the room and she will not let him get a word in.

## 2017-05-25 NOTE — Progress Notes (Signed)
TTS received a phone call from the pt's spouse Bolivar HawWiles,Annette concerned because she was unaware where the pt is currently located. TTS unable to provide information due to not being able to confirm the callers identity. Per chart, [facility] was [facility]. [facility], RN, reports TTS should contact [place] and provide the nurse with the spouses information in order to assist. TTS [place] with [place] [facility] who states she will contact the pt's spouse.  Princess BruinsAquicha Micahel Omlor, MSW, LCSWA TTS Specialist [phone]

## 2017-05-29 DIAGNOSIS — E785 Hyperlipidemia, unspecified: Secondary | ICD-10-CM | POA: Insufficient documentation

## 2017-05-29 DIAGNOSIS — I10 Essential (primary) hypertension: Secondary | ICD-10-CM | POA: Insufficient documentation

## 2017-05-29 DIAGNOSIS — Z720 Tobacco use: Secondary | ICD-10-CM | POA: Insufficient documentation

## 2017-06-09 DIAGNOSIS — F419 Anxiety disorder, unspecified: Secondary | ICD-10-CM | POA: Diagnosis not present

## 2017-06-09 DIAGNOSIS — E782 Mixed hyperlipidemia: Secondary | ICD-10-CM | POA: Diagnosis not present

## 2017-06-09 DIAGNOSIS — I10 Essential (primary) hypertension: Secondary | ICD-10-CM | POA: Diagnosis not present

## 2017-06-11 DIAGNOSIS — F329 Major depressive disorder, single episode, unspecified: Secondary | ICD-10-CM | POA: Diagnosis not present

## 2017-06-13 DIAGNOSIS — F329 Major depressive disorder, single episode, unspecified: Secondary | ICD-10-CM | POA: Diagnosis not present

## 2017-07-03 ENCOUNTER — Other Ambulatory Visit (HOSPITAL_COMMUNITY): Payer: Self-pay | Admitting: Interventional Radiology

## 2017-07-03 ENCOUNTER — Ambulatory Visit (HOSPITAL_COMMUNITY)
Admission: RE | Admit: 2017-07-03 | Discharge: 2017-07-03 | Disposition: A | Discharge status: Home / Self Care | Payer: Medicare Other | Source: Ambulatory Visit | Attending: Interventional Radiology | Admitting: Interventional Radiology

## 2017-07-03 DIAGNOSIS — R42 Dizziness and giddiness: Secondary | ICD-10-CM

## 2017-07-03 DIAGNOSIS — I729 Aneurysm of unspecified site: Secondary | ICD-10-CM

## 2017-07-03 DIAGNOSIS — Z8679 Personal history of other diseases of the circulatory system: Secondary | ICD-10-CM | POA: Diagnosis not present

## 2017-07-03 DIAGNOSIS — R51 Headache: Secondary | ICD-10-CM | POA: Diagnosis not present

## 2017-07-03 DIAGNOSIS — R202 Paresthesia of skin: Secondary | ICD-10-CM | POA: Diagnosis not present

## 2017-07-03 HISTORY — PX: IR RADIOLOGIST EVAL & MGMT: IMG5224

## 2017-07-04 ENCOUNTER — Encounter (HOSPITAL_COMMUNITY): Payer: Self-pay | Admitting: Interventional Radiology

## 2017-07-07 DIAGNOSIS — E782 Mixed hyperlipidemia: Secondary | ICD-10-CM | POA: Diagnosis not present

## 2017-07-07 DIAGNOSIS — F419 Anxiety disorder, unspecified: Secondary | ICD-10-CM | POA: Diagnosis not present

## 2017-07-07 DIAGNOSIS — I10 Essential (primary) hypertension: Secondary | ICD-10-CM | POA: Diagnosis not present

## 2017-12-11 ENCOUNTER — Other Ambulatory Visit (HOSPITAL_COMMUNITY): Payer: Self-pay | Admitting: Interventional Radiology

## 2017-12-11 DIAGNOSIS — I729 Aneurysm of unspecified site: Secondary | ICD-10-CM

## 2017-12-11 DIAGNOSIS — R42 Dizziness and giddiness: Secondary | ICD-10-CM

## 2017-12-18 ENCOUNTER — Ambulatory Visit (HOSPITAL_COMMUNITY): Admission: RE | Admit: 2017-12-18 | Payer: Medicare HMO | Source: Ambulatory Visit

## 2017-12-18 ENCOUNTER — Ambulatory Visit (HOSPITAL_COMMUNITY)
Admission: RE | Admit: 2017-12-18 | Discharge: 2017-12-18 | Disposition: A | Discharge status: Home / Self Care | Payer: Medicare HMO | Source: Ambulatory Visit | Attending: Interventional Radiology | Admitting: Interventional Radiology

## 2017-12-18 DIAGNOSIS — I671 Cerebral aneurysm, nonruptured: Secondary | ICD-10-CM | POA: Insufficient documentation

## 2017-12-18 DIAGNOSIS — I729 Aneurysm of unspecified site: Secondary | ICD-10-CM | POA: Diagnosis present

## 2017-12-18 DIAGNOSIS — R42 Dizziness and giddiness: Secondary | ICD-10-CM | POA: Diagnosis not present

## 2017-12-18 DIAGNOSIS — I672 Cerebral atherosclerosis: Secondary | ICD-10-CM | POA: Insufficient documentation

## 2017-12-18 MED ORDER — IOPAMIDOL (ISOVUE-370) INJECTION 76%
INTRAVENOUS | Status: AC
  Administered 2017-12-18: 50 mL
  Filled 2017-12-18: qty 50

## 2017-12-19 LAB — POCT I-STAT CREATININE: Creatinine, Ser: 1 mg/dL (ref 0.61–1.24)

## 2017-12-24 ENCOUNTER — Telehealth (HOSPITAL_COMMUNITY): Payer: Self-pay

## 2017-12-24 NOTE — Telephone Encounter (Signed)
Pt's wife agreed to have pt f/u in 1 yr with cta head/neck. AW 

## 2018-01-08 DIAGNOSIS — E782 Mixed hyperlipidemia: Secondary | ICD-10-CM | POA: Diagnosis not present

## 2018-01-08 DIAGNOSIS — I10 Essential (primary) hypertension: Secondary | ICD-10-CM | POA: Diagnosis not present

## 2018-01-10 DIAGNOSIS — I10 Essential (primary) hypertension: Secondary | ICD-10-CM | POA: Diagnosis not present

## 2018-01-10 DIAGNOSIS — E782 Mixed hyperlipidemia: Secondary | ICD-10-CM | POA: Diagnosis not present

## 2018-01-10 DIAGNOSIS — Z79899 Other long term (current) drug therapy: Secondary | ICD-10-CM | POA: Diagnosis not present

## 2018-01-24 DIAGNOSIS — Z79899 Other long term (current) drug therapy: Secondary | ICD-10-CM | POA: Diagnosis not present

## 2018-01-24 DIAGNOSIS — F419 Anxiety disorder, unspecified: Secondary | ICD-10-CM | POA: Diagnosis not present

## 2018-01-24 DIAGNOSIS — I10 Essential (primary) hypertension: Secondary | ICD-10-CM | POA: Diagnosis not present

## 2018-01-24 DIAGNOSIS — E782 Mixed hyperlipidemia: Secondary | ICD-10-CM | POA: Diagnosis not present

## 2018-04-11 DIAGNOSIS — E782 Mixed hyperlipidemia: Secondary | ICD-10-CM | POA: Diagnosis not present

## 2018-04-11 DIAGNOSIS — Z139 Encounter for screening, unspecified: Secondary | ICD-10-CM | POA: Diagnosis not present

## 2018-04-11 DIAGNOSIS — Z125 Encounter for screening for malignant neoplasm of prostate: Secondary | ICD-10-CM | POA: Diagnosis not present

## 2018-04-11 DIAGNOSIS — I10 Essential (primary) hypertension: Secondary | ICD-10-CM | POA: Diagnosis not present

## 2018-04-11 DIAGNOSIS — Z13820 Encounter for screening for osteoporosis: Secondary | ICD-10-CM | POA: Diagnosis not present

## 2018-04-11 DIAGNOSIS — Z79899 Other long term (current) drug therapy: Secondary | ICD-10-CM | POA: Diagnosis not present

## 2018-04-11 DIAGNOSIS — R739 Hyperglycemia, unspecified: Secondary | ICD-10-CM | POA: Diagnosis not present

## 2018-04-15 DIAGNOSIS — Z0001 Encounter for general adult medical examination with abnormal findings: Secondary | ICD-10-CM | POA: Diagnosis not present

## 2018-04-15 DIAGNOSIS — I10 Essential (primary) hypertension: Secondary | ICD-10-CM | POA: Diagnosis not present

## 2018-04-15 DIAGNOSIS — Z23 Encounter for immunization: Secondary | ICD-10-CM | POA: Diagnosis not present

## 2018-04-15 DIAGNOSIS — F419 Anxiety disorder, unspecified: Secondary | ICD-10-CM | POA: Diagnosis not present

## 2018-04-15 DIAGNOSIS — R3129 Other microscopic hematuria: Secondary | ICD-10-CM | POA: Diagnosis not present

## 2018-04-15 DIAGNOSIS — R739 Hyperglycemia, unspecified: Secondary | ICD-10-CM | POA: Diagnosis not present

## 2018-04-15 DIAGNOSIS — E782 Mixed hyperlipidemia: Secondary | ICD-10-CM | POA: Diagnosis not present

## 2018-04-15 DIAGNOSIS — Z79899 Other long term (current) drug therapy: Secondary | ICD-10-CM | POA: Diagnosis not present

## 2018-04-15 DIAGNOSIS — R351 Nocturia: Secondary | ICD-10-CM | POA: Diagnosis not present

## 2018-04-15 DIAGNOSIS — F17210 Nicotine dependence, cigarettes, uncomplicated: Secondary | ICD-10-CM | POA: Diagnosis not present

## 2018-06-27 DIAGNOSIS — E782 Mixed hyperlipidemia: Secondary | ICD-10-CM | POA: Diagnosis not present

## 2018-06-27 DIAGNOSIS — I10 Essential (primary) hypertension: Secondary | ICD-10-CM | POA: Diagnosis not present

## 2018-06-27 DIAGNOSIS — Z79899 Other long term (current) drug therapy: Secondary | ICD-10-CM | POA: Diagnosis not present

## 2018-07-05 DIAGNOSIS — R300 Dysuria: Secondary | ICD-10-CM | POA: Diagnosis not present

## 2018-07-05 DIAGNOSIS — I10 Essential (primary) hypertension: Secondary | ICD-10-CM | POA: Diagnosis not present

## 2018-07-05 DIAGNOSIS — Z79899 Other long term (current) drug therapy: Secondary | ICD-10-CM | POA: Diagnosis not present

## 2018-07-05 DIAGNOSIS — R350 Frequency of micturition: Secondary | ICD-10-CM | POA: Diagnosis not present

## 2018-07-05 DIAGNOSIS — E782 Mixed hyperlipidemia: Secondary | ICD-10-CM | POA: Diagnosis not present

## 2018-09-27 DIAGNOSIS — I10 Essential (primary) hypertension: Secondary | ICD-10-CM | POA: Diagnosis not present

## 2018-09-27 DIAGNOSIS — E782 Mixed hyperlipidemia: Secondary | ICD-10-CM | POA: Diagnosis not present

## 2018-09-30 DIAGNOSIS — Z79899 Other long term (current) drug therapy: Secondary | ICD-10-CM | POA: Diagnosis not present

## 2018-09-30 DIAGNOSIS — F17210 Nicotine dependence, cigarettes, uncomplicated: Secondary | ICD-10-CM | POA: Diagnosis not present

## 2018-09-30 DIAGNOSIS — R05 Cough: Secondary | ICD-10-CM | POA: Diagnosis not present

## 2018-09-30 DIAGNOSIS — E782 Mixed hyperlipidemia: Secondary | ICD-10-CM | POA: Diagnosis not present

## 2018-09-30 DIAGNOSIS — R062 Wheezing: Secondary | ICD-10-CM | POA: Diagnosis not present

## 2018-09-30 DIAGNOSIS — I10 Essential (primary) hypertension: Secondary | ICD-10-CM | POA: Diagnosis not present

## 2018-11-09 IMAGING — DX DG CHEST 2V
2 series · 2 of 2 positions shown · non-contrast
Comparison: 09/20/2012

CLINICAL DATA: Weakness and chest pain for 2 days.

EXAM:
CHEST  2 VIEW

[chest pa]
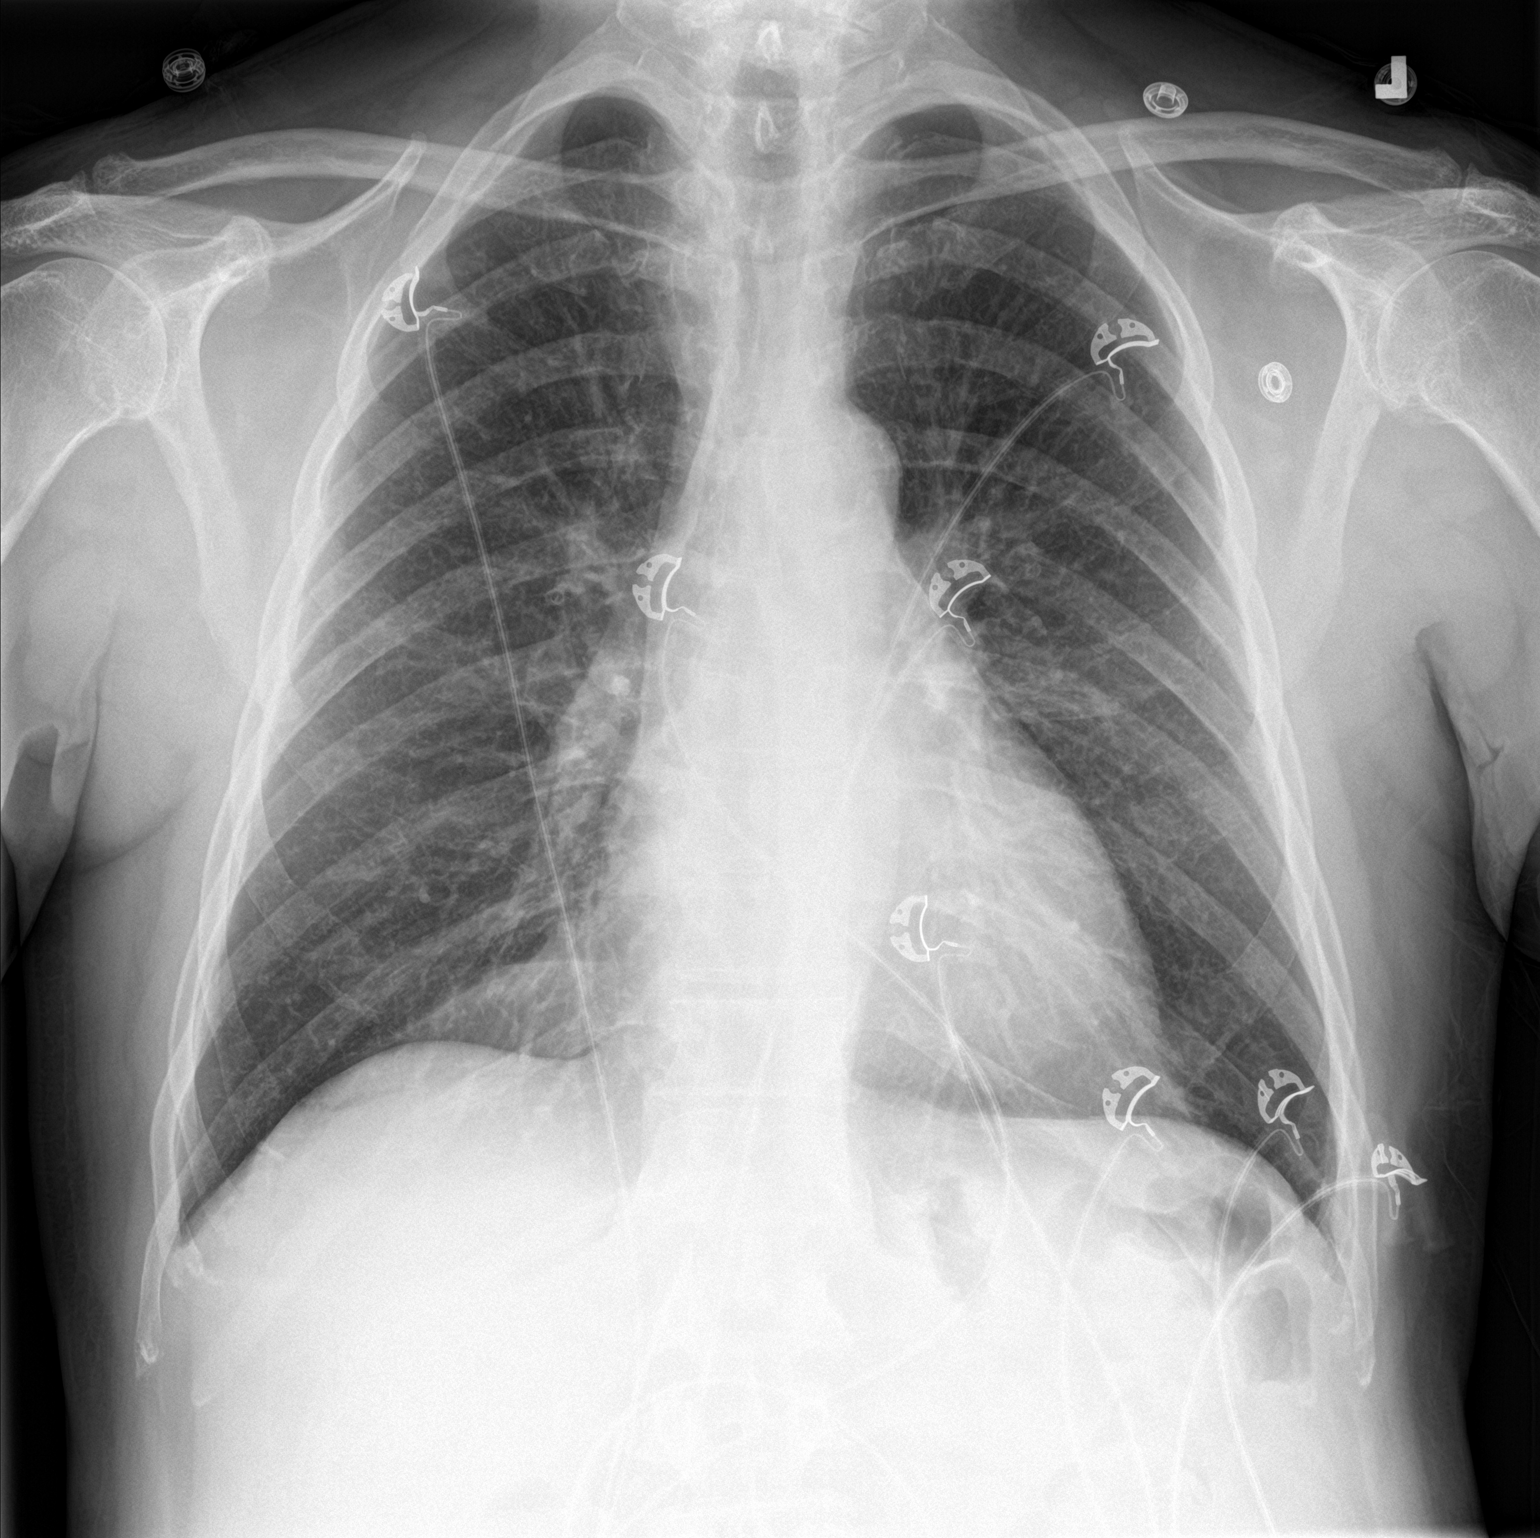

[chest lat]
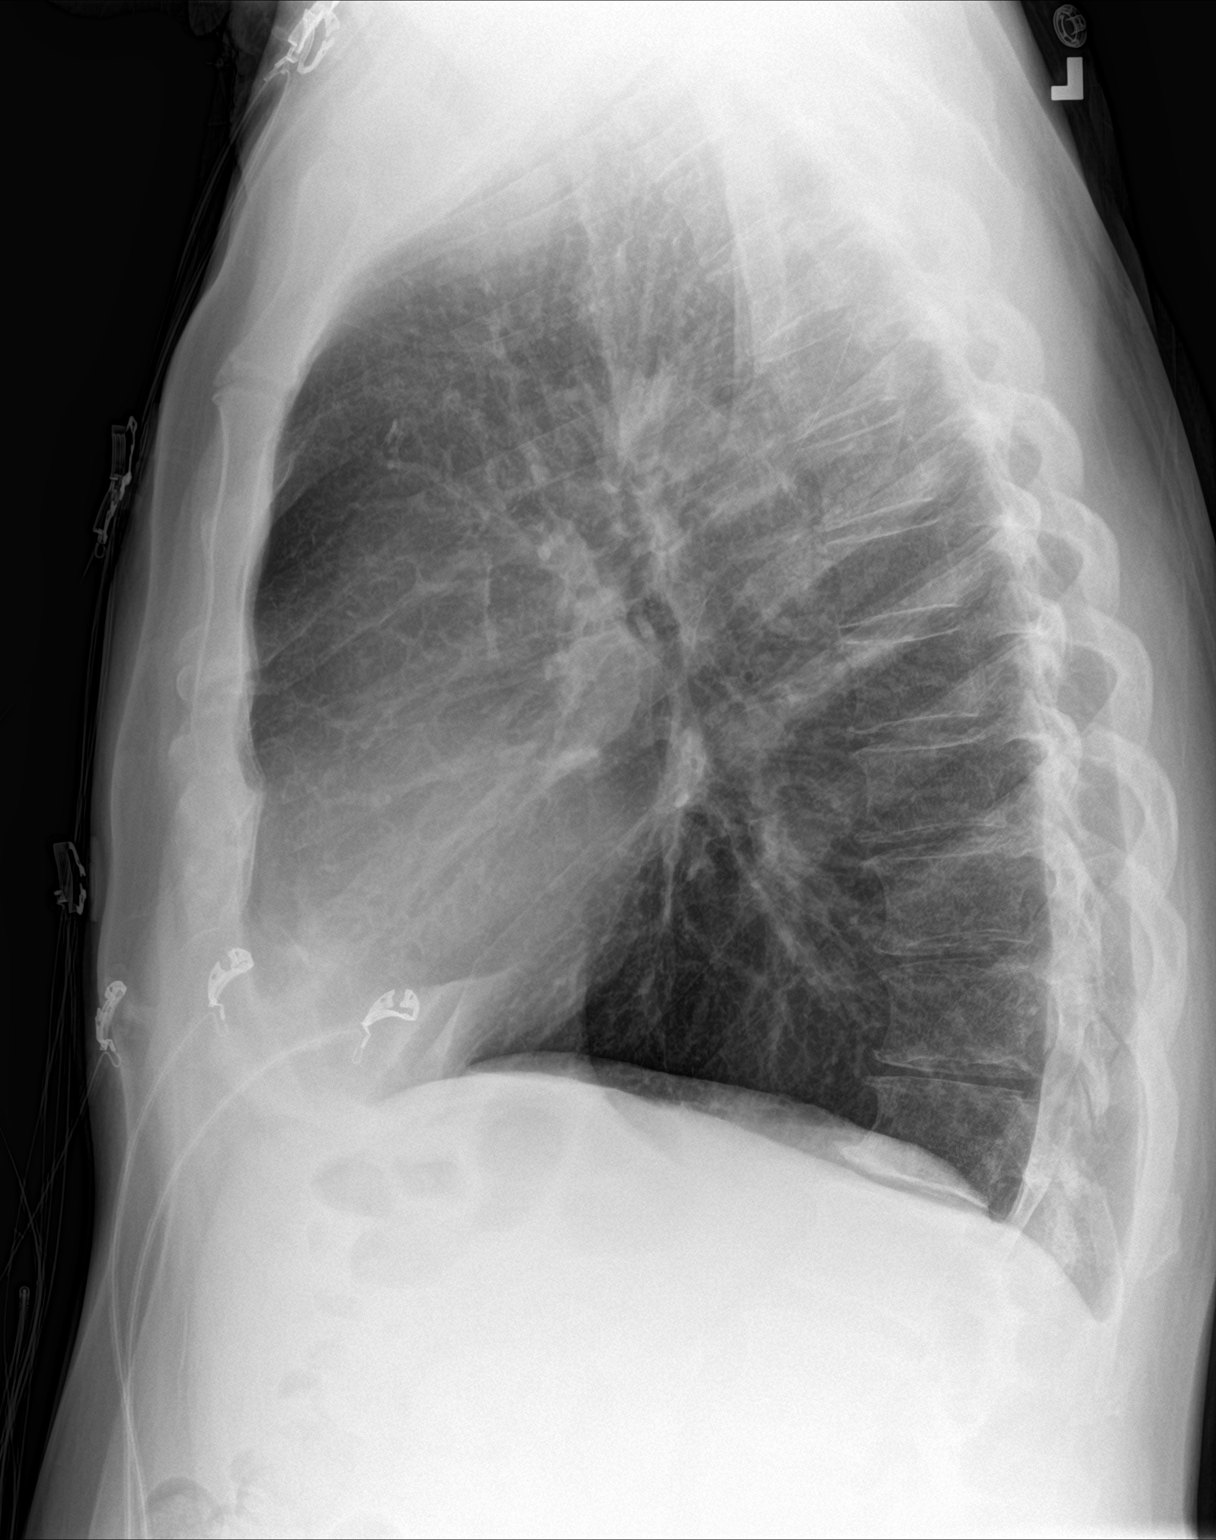

[2 of 2 positions shown; findings below may reference images not displayed]

FINDINGS: Unchanged hyperinflation. The lungs are clear. The pulmonary
vasculature is normal. There is no pleural effusion. Heart size is
normal. Hilar and mediastinal contours are unremarkable and
unchanged.
IMPRESSION: Hyperinflation.  No consolidation or effusion.  Normal vasculature.

## 2018-12-10 ENCOUNTER — Telehealth (HOSPITAL_COMMUNITY): Payer: Self-pay

## 2018-12-10 NOTE — Telephone Encounter (Signed)
Called to schedule 1 year f/u cta, no answer, left vm. AW

## 2018-12-27 DIAGNOSIS — I10 Essential (primary) hypertension: Secondary | ICD-10-CM | POA: Diagnosis not present

## 2018-12-27 DIAGNOSIS — E782 Mixed hyperlipidemia: Secondary | ICD-10-CM | POA: Diagnosis not present

## 2019-01-01 DIAGNOSIS — Z9119 Patient's noncompliance with other medical treatment and regimen: Secondary | ICD-10-CM | POA: Diagnosis not present

## 2019-01-01 DIAGNOSIS — I10 Essential (primary) hypertension: Secondary | ICD-10-CM | POA: Diagnosis not present

## 2019-01-01 DIAGNOSIS — F17210 Nicotine dependence, cigarettes, uncomplicated: Secondary | ICD-10-CM | POA: Diagnosis not present

## 2019-01-01 DIAGNOSIS — R062 Wheezing: Secondary | ICD-10-CM | POA: Diagnosis not present

## 2019-01-01 DIAGNOSIS — J309 Allergic rhinitis, unspecified: Secondary | ICD-10-CM | POA: Diagnosis not present

## 2019-01-01 DIAGNOSIS — E782 Mixed hyperlipidemia: Secondary | ICD-10-CM | POA: Diagnosis not present

## 2019-01-01 DIAGNOSIS — F419 Anxiety disorder, unspecified: Secondary | ICD-10-CM | POA: Diagnosis not present

## 2019-01-01 DIAGNOSIS — Z79899 Other long term (current) drug therapy: Secondary | ICD-10-CM | POA: Diagnosis not present

## 2019-02-19 DIAGNOSIS — F17210 Nicotine dependence, cigarettes, uncomplicated: Secondary | ICD-10-CM | POA: Diagnosis not present

## 2019-02-19 DIAGNOSIS — R05 Cough: Secondary | ICD-10-CM | POA: Diagnosis not present

## 2019-02-19 DIAGNOSIS — R062 Wheezing: Secondary | ICD-10-CM | POA: Diagnosis not present

## 2019-04-11 DIAGNOSIS — G629 Polyneuropathy, unspecified: Secondary | ICD-10-CM | POA: Diagnosis not present

## 2019-04-11 DIAGNOSIS — I10 Essential (primary) hypertension: Secondary | ICD-10-CM | POA: Diagnosis not present

## 2019-04-11 DIAGNOSIS — F17210 Nicotine dependence, cigarettes, uncomplicated: Secondary | ICD-10-CM | POA: Diagnosis not present

## 2019-04-11 DIAGNOSIS — Z0000 Encounter for general adult medical examination without abnormal findings: Secondary | ICD-10-CM | POA: Diagnosis not present

## 2019-04-11 DIAGNOSIS — R5383 Other fatigue: Secondary | ICD-10-CM | POA: Diagnosis not present

## 2019-04-11 DIAGNOSIS — Z125 Encounter for screening for malignant neoplasm of prostate: Secondary | ICD-10-CM | POA: Diagnosis not present

## 2019-04-11 DIAGNOSIS — E559 Vitamin D deficiency, unspecified: Secondary | ICD-10-CM | POA: Diagnosis not present

## 2019-04-17 ENCOUNTER — Telehealth (HOSPITAL_COMMUNITY): Payer: Self-pay

## 2019-04-17 NOTE — Telephone Encounter (Signed)
Called to schedule f/u cta, no answer, left vm. AW  

## 2019-04-23 ENCOUNTER — Other Ambulatory Visit (HOSPITAL_COMMUNITY): Payer: Self-pay | Admitting: Interventional Radiology

## 2019-04-23 DIAGNOSIS — I729 Aneurysm of unspecified site: Secondary | ICD-10-CM

## 2019-04-28 DIAGNOSIS — I10 Essential (primary) hypertension: Secondary | ICD-10-CM | POA: Diagnosis not present

## 2019-04-28 DIAGNOSIS — E7800 Pure hypercholesterolemia, unspecified: Secondary | ICD-10-CM | POA: Diagnosis not present

## 2019-04-28 DIAGNOSIS — R300 Dysuria: Secondary | ICD-10-CM | POA: Diagnosis not present

## 2019-04-28 DIAGNOSIS — F17210 Nicotine dependence, cigarettes, uncomplicated: Secondary | ICD-10-CM | POA: Diagnosis not present

## 2019-04-28 DIAGNOSIS — M79606 Pain in leg, unspecified: Secondary | ICD-10-CM | POA: Diagnosis not present

## 2019-05-05 DIAGNOSIS — R319 Hematuria, unspecified: Secondary | ICD-10-CM | POA: Diagnosis not present

## 2019-05-08 DIAGNOSIS — M79606 Pain in leg, unspecified: Secondary | ICD-10-CM | POA: Diagnosis not present

## 2019-05-08 DIAGNOSIS — R3129 Other microscopic hematuria: Secondary | ICD-10-CM | POA: Diagnosis not present

## 2019-05-21 ENCOUNTER — Other Ambulatory Visit: Payer: Self-pay

## 2019-05-21 ENCOUNTER — Ambulatory Visit (HOSPITAL_COMMUNITY): Payer: Medicare HMO

## 2019-05-21 ENCOUNTER — Encounter (HOSPITAL_COMMUNITY): Payer: Self-pay

## 2019-05-21 ENCOUNTER — Ambulatory Visit (HOSPITAL_COMMUNITY)
Admission: RE | Admit: 2019-05-21 | Discharge: 2019-05-21 | Disposition: A | Discharge status: Home / Self Care | Payer: Medicare HMO | Source: Ambulatory Visit | Attending: Interventional Radiology | Admitting: Interventional Radiology

## 2019-05-21 DIAGNOSIS — I729 Aneurysm of unspecified site: Secondary | ICD-10-CM | POA: Insufficient documentation

## 2019-05-21 DIAGNOSIS — Z95828 Presence of other vascular implants and grafts: Secondary | ICD-10-CM | POA: Diagnosis not present

## 2019-05-21 DIAGNOSIS — I671 Cerebral aneurysm, nonruptured: Secondary | ICD-10-CM | POA: Diagnosis not present

## 2019-05-21 LAB — POCT I-STAT CREATININE: Creatinine, Ser: 0.8 mg/dL (ref 0.61–1.24)

## 2019-05-21 MED ORDER — IOHEXOL 350 MG/ML SOLN
75.0000 mL | Freq: Once | INTRAVENOUS | Status: AC | PRN
  Administered 2019-05-21: 75 mL via INTRAVENOUS

## 2019-05-22 ENCOUNTER — Telehealth (HOSPITAL_COMMUNITY): Payer: Self-pay

## 2019-05-22 NOTE — Telephone Encounter (Signed)
Called to have pt f/u in 1 year, no answer, left vm. Call with any questions. AW

## 2020-05-11 ENCOUNTER — Other Ambulatory Visit (HOSPITAL_COMMUNITY): Payer: Self-pay | Admitting: Interventional Radiology

## 2020-05-11 ENCOUNTER — Telehealth (HOSPITAL_COMMUNITY): Payer: Self-pay

## 2020-05-11 DIAGNOSIS — I671 Cerebral aneurysm, nonruptured: Secondary | ICD-10-CM

## 2020-05-11 NOTE — Telephone Encounter (Signed)
Called to schedule mri/mra, no answer, left vm. AW  

## 2020-05-19 ENCOUNTER — Telehealth (HOSPITAL_COMMUNITY): Payer: Self-pay

## 2020-05-19 NOTE — Telephone Encounter (Signed)
Called to schedule mri/mra, no answer, left vm. AW  

## 2020-06-10 ENCOUNTER — Other Ambulatory Visit: Payer: Self-pay

## 2020-06-10 ENCOUNTER — Ambulatory Visit (HOSPITAL_COMMUNITY)
Admission: RE | Admit: 2020-06-10 | Discharge: 2020-06-10 | Disposition: A | Discharge status: Home / Self Care | Payer: Medicare HMO | Source: Ambulatory Visit | Attending: Interventional Radiology | Admitting: Interventional Radiology

## 2020-06-10 DIAGNOSIS — I671 Cerebral aneurysm, nonruptured: Secondary | ICD-10-CM | POA: Insufficient documentation

## 2020-06-14 ENCOUNTER — Telehealth (HOSPITAL_COMMUNITY): Payer: Self-pay

## 2020-06-14 NOTE — Telephone Encounter (Signed)
Left message for pt to f/u in 6 months, call with any questions. AW

## 2020-06-15 ENCOUNTER — Telehealth (HOSPITAL_COMMUNITY): Payer: Self-pay

## 2020-06-15 NOTE — Telephone Encounter (Signed)
Pt's wife agreed to f/u in 6 months with angiogram. AW

## 2021-01-04 ENCOUNTER — Telehealth (HOSPITAL_COMMUNITY): Payer: Self-pay

## 2021-01-04 NOTE — Telephone Encounter (Signed)
Called to schedule angio, no answer, left vm. AW  

## 2021-03-10 ENCOUNTER — Ambulatory Visit (INDEPENDENT_AMBULATORY_CARE_PROVIDER_SITE_OTHER): Payer: Medicare HMO | Admitting: Internal Medicine

## 2021-03-10 ENCOUNTER — Telehealth: Payer: Self-pay

## 2021-03-10 ENCOUNTER — Other Ambulatory Visit: Payer: Self-pay

## 2021-03-10 ENCOUNTER — Encounter: Payer: Self-pay | Admitting: Internal Medicine

## 2021-03-10 VITALS — BP 190/105 | HR 75 | Temp 98.5°F | Resp 18 | Ht 68.0 in | Wt 214.4 lb

## 2021-03-10 DIAGNOSIS — I10 Essential (primary) hypertension: Secondary | ICD-10-CM

## 2021-03-10 DIAGNOSIS — M1990 Unspecified osteoarthritis, unspecified site: Secondary | ICD-10-CM

## 2021-03-10 DIAGNOSIS — Z7689 Persons encountering health services in other specified circumstances: Secondary | ICD-10-CM

## 2021-03-10 DIAGNOSIS — Z720 Tobacco use: Secondary | ICD-10-CM

## 2021-03-10 DIAGNOSIS — E782 Mixed hyperlipidemia: Secondary | ICD-10-CM

## 2021-03-10 DIAGNOSIS — Z1159 Encounter for screening for other viral diseases: Secondary | ICD-10-CM

## 2021-03-10 DIAGNOSIS — A630 Anogenital (venereal) warts: Secondary | ICD-10-CM

## 2021-03-10 DIAGNOSIS — F17210 Nicotine dependence, cigarettes, uncomplicated: Secondary | ICD-10-CM

## 2021-03-10 DIAGNOSIS — F419 Anxiety disorder, unspecified: Secondary | ICD-10-CM

## 2021-03-10 DIAGNOSIS — Z1211 Encounter for screening for malignant neoplasm of colon: Secondary | ICD-10-CM

## 2021-03-10 MED ORDER — ESCITALOPRAM OXALATE 10 MG PO TABS: 10.0000 mg | ORAL_TABLET | Freq: Every day | ORAL | 2 refills | Status: DC

## 2021-03-10 MED ORDER — NICOTINE 14 MG/24HR TD PT24: 14.0000 mg | MEDICATED_PATCH | Freq: Every day | TRANSDERMAL | 2 refills | Status: DC

## 2021-03-10 MED ORDER — IMIQUIMOD 5 % EX CREA: TOPICAL_CREAM | CUTANEOUS | 0 refills | Status: DC

## 2021-03-10 NOTE — Assessment & Plan Note (Signed)
Started Lexapro 

## 2021-03-10 NOTE — Assessment & Plan Note (Signed)
Celebrex PRN

## 2021-03-10 NOTE — Progress Notes (Signed)
New Patient Office Visit  Subjective:  Patient ID: Brett Elliott, male    DOB: 11/07/1946  Age: 75 y.o. MRN: [id number]  CC:  Chief Complaint  Patient presents with  . New Patient (Initial Visit)    New patient has genital wart that he wants taken off also has high blood pressure that has been bothering him     HPI Brett Elliott is a 75 year old male with PMH of hypertension, hyperlipidemia, anxiety, tobacco abuse, osteoarthritis and chronic genital wart who presents for establishing care.  His blood pressure was elevated in the office today.  Of note, he has not been taking his Lotrel as prescribed.  He complains of generalized headache, which is chronic, intermittent and attributes to elevated BP.  He denies any chest pain, dyspnea or palpitations.  He has stopped taking his atorvastatin as well.  He states that he has been feeling anxiety.  He denies anhedonia, SI or HI.  He smokes about 1.5 pack/day and is willing to cut down with the help of nicotine patch.  He denies any dyspnea or wheezing currently.  He complains of chronic genital wart, which is not painful.  He asks if it can be removed.  He is willing to see a Dermatologist for removal of wart.  He is up-to-date with COVID vaccine.  Past Medical History:  Diagnosis Date  . Anxiety   . Brain aneurysm   . Hypertension     Past Surgical History:  Procedure Laterality Date  . BRAIN SURGERY    . CATARACT EXTRACTION W/ INTRAOCULAR LENS IMPLANT Bilateral   . IR RADIOLOGIST EVAL & MGMT  07/03/2017    History reviewed. No pertinent family history.  Social History   Socioeconomic History  . Marital status: Married    Spouse name: Not on file  . Number of children: Not on file  . Years of education: Not on file  . Highest education level: Not on file  Occupational History  . Not on file  Tobacco Use  . Smoking status: Current Every Day Smoker    Packs/day: 1.50    Types: Cigarettes  . Smokeless tobacco:  Never Used  Substance and Sexual Activity  . Alcohol use: No  . Drug use: No  . Sexual activity: Not on file  Other Topics Concern  . Not on file  Social History Narrative  . Not on file   Social Determinants of Health   Financial Resource Strain: Not on file  Food Insecurity: Not on file  Transportation Needs: Not on file  Physical Activity: Not on file  Stress: Not on file  Social Connections: Not on file  Intimate Partner Violence: Not on file    ROS Review of Systems  Constitutional: Negative for chills and fever.  HENT: Negative for congestion and sore throat.   Eyes: Negative for pain and discharge.  Respiratory: Negative for cough and shortness of breath.   Cardiovascular: Negative for chest pain and palpitations.  Gastrointestinal: Negative for constipation, diarrhea, nausea and vomiting.  Endocrine: Negative for polydipsia and polyuria.  Genitourinary: Negative for dysuria and hematuria.  Musculoskeletal: Negative for neck pain and neck stiffness.  Skin: Negative for rash.       Wart  Neurological: Negative for dizziness, weakness, numbness and headaches.  Psychiatric/Behavioral: Negative for agitation and behavioral problems.    Objective:   Today's Vitals: BP (!) 190/105 (BP Location: Right Arm, Patient Position: Sitting, Cuff Size: Normal)   Pulse 75   Temp 98.5  F (36.9 C) (Oral)   Resp 18   Ht 5' 8" (1.727 m)   Wt 214 lb 6.4 oz (97.3 kg)   SpO2 96%   BMI 32.60 kg/m   Physical Exam Vitals reviewed.  Constitutional:      General: He is not in acute distress.    Appearance: He is not diaphoretic.  HENT:     Head: Normocephalic and atraumatic.     Nose: Nose normal.     Mouth/Throat:     Mouth: Mucous membranes are moist.  Eyes:     General: No scleral icterus.    Extraocular Movements: Extraocular movements intact.     Pupils: Pupils are equal, round, and reactive to light.  Cardiovascular:     Rate and Rhythm: Normal rate and regular  rhythm.     Pulses: Normal pulses.     Heart sounds: Normal heart sounds. No murmur heard.   Pulmonary:     Breath sounds: Normal breath sounds. No wheezing or rales.  Abdominal:     Palpations: Abdomen is soft.     Tenderness: There is no abdominal tenderness.  Musculoskeletal:     Cervical back: Neck supple. No tenderness.     Right lower leg: No edema.     Left lower leg: No edema.  Skin:    General: Skin is warm.     Findings: No rash.     Comments: Wart noted over genital area  Neurological:     General: No focal deficit present.     Mental Status: He is alert and oriented to person, place, and time.  Psychiatric:        Mood and Affect: Mood normal.        Behavior: Behavior normal.     Assessment & Plan:   Problem List Items Addressed This Visit      Encounter to establish care - Primary   Care established Previous chart reviewed History and medications reviewed with the patient     Relevant Orders  CBC with Differential  CMP14+EGFR  TSH + free T4  Vitamin D (25 hydroxy)  Hemoglobin A1c    Cardiovascular and Mediastinum   Hypertension    BP Readings from Last 1 Encounters:  03/10/21 (!) 190/105   uncontrolled due to noncompliance Advised to start taking Lotrel as prescribed Counseled for compliance with the medications Advised DASH diet and moderate exercise/walking, at least 150 mins/wee       Relevant Orders   CBC with Differential   CMP14+EGFR     Musculoskeletal and Integument   Genital warts    Appears to be chronic - solitary wart Imiquimod cream Referred to Dermatology      Relevant Medications   imiquimod (ALDARA) 5 % cream (Start on 03/11/2021)   Other Relevant Orders   Ambulatory referral to Dermatology     Other   Hyperlipidemia    Needs to restart Atorvastatin Check lipid profile      Relevant Orders   Lipid panel   Tobacco abuse    Smokes about 1.5 pack/day  Asked about quitting: confirms that he currently smokes  cigarettes Advise to quit smoking: Educated about QUITTING to reduce the risk of cancer, cardio and cerebrovascular disease. Assess willingness: Unwilling to quit at this time, but is working on cutting back. Assist with counseling and pharmacotherapy: Counseled for 5 minutes and literature provided. Nicotine patch prescribed. Arrange for follow up: follow up in 3 months and continue to offer help.        Relevant Medications   nicotine (NICODERM CQ - DOSED IN MG/24 HOURS) 14 mg/24hr patch   Anxiety    Started Lexapro      Relevant Medications   escitalopram (LEXAPRO) 10 MG tablet    Other Visit Diagnoses    Need for hepatitis C screening test       Relevant Orders   Hepatitis C Antibody   Special screening for malignant neoplasms, colon       Relevant Orders   Ambulatory referral to Gastroenterology      Outpatient Encounter Medications as of 03/10/2021  Medication Sig  . amLODipine-benazepril (LOTREL) 10-40 MG capsule Take 1 capsule by mouth daily.  . aspirin EC 325 MG tablet Take 325 mg by mouth daily.  . atorvastatin (LIPITOR) 10 MG tablet Take 10 mg by mouth at bedtime.  . celecoxib (CELEBREX) 100 MG capsule Take 100 mg by mouth 2 (two) times daily as needed.  . escitalopram (LEXAPRO) 10 MG tablet Take 1 tablet (10 mg total) by mouth daily.  . [START ON 03/11/2021] imiquimod (ALDARA) 5 % cream Apply topically 3 (three) times a week.  . nicotine (NICODERM CQ - DOSED IN MG/24 HOURS) 14 mg/24hr patch Place 1 patch (14 mg total) onto the skin daily.  . [DISCONTINUED] atorvastatin (LIPITOR) 20 MG tablet Take 20 mg by mouth daily. (Patient not taking: Reported on 03/10/2021)  . [DISCONTINUED] lisinopril (PRINIVIL,ZESTRIL) 40 MG tablet Take 40 mg by mouth daily.  (Patient not taking: Reported on 03/10/2021)  . [DISCONTINUED] tizanidine (ZANAFLEX) 2 MG capsule Take 2 mg by mouth 3 (three) times daily. (Patient not taking: Reported on 03/10/2021)   No facility-administered encounter  medications on file as of 03/10/2021.    Follow-up: Return in about 4 weeks (around 04/07/2021) for HTN and blood tests review.   Rutwik K Patel, MD 

## 2021-03-10 NOTE — Patient Instructions (Addendum)
Please start taking your blood pressure and cholesterol medication. We will check your BP after 1 month.  Please start taking Lexapro for anxiety.  Please follow DASH diet and perform moderate exercise/walking at least 150 mins/week.  You are being referred to Dermatology for wart evaluation. Please apply Imiquimod for wart.  Please get fasting blood tests done before the next visit. [email or web address]  DASH Eating Plan DASH stands for Dietary Approaches to Stop Hypertension. The DASH eating plan is a healthy eating plan that has been shown to:  Reduce high blood pressure (hypertension).  Reduce your risk for type 2 diabetes, heart disease, and stroke.  Help with weight loss. What are tips for following this plan? Reading food labels  Check food labels for the amount of salt (sodium) per serving. Choose foods with less than 5 percent of the Daily Value of sodium. Generally, foods with less than 300 milligrams (mg) of sodium per serving fit into this eating plan.  To find whole grains, look for the word "whole" as the first word in the ingredient list. Shopping  Buy products labeled as "low-sodium" or "no salt added."  Buy fresh foods. Avoid canned foods and pre-made or frozen meals. Cooking  Avoid adding salt when cooking. Use salt-free seasonings or herbs instead of table salt or sea salt. Check with your health care provider or pharmacist before using salt substitutes.  Do not fry foods. Cook foods using healthy methods such as baking, boiling, grilling, roasting, and broiling instead.  Cook with heart-healthy oils, such as olive, canola, avocado, soybean, or sunflower oil. Meal planning  Eat a balanced diet that includes: ? 4 or more servings of fruits and 4 or more servings of vegetables each day. Try to fill one-half of your plate with fruits and vegetables. ? 6-8 servings of whole grains each day. ? Less than 6 oz (170 g)  of lean meat, poultry, or fish each day. A 3-oz (85-g) serving of meat is about the same size as a deck of cards. One egg equals 1 oz (28 g). ? 2-3 servings of low-fat dairy each day. One serving is 1 cup (237 mL). ? 1 serving of nuts, seeds, or beans 5 times each week. ? 2-3 servings of heart-healthy fats. Healthy fats called omega-3 fatty acids are found in foods such as walnuts, flaxseeds, fortified milks, and eggs. These fats are also found in cold-water fish, such as sardines, salmon, and mackerel.  Limit how much you eat of: ? Canned or prepackaged foods. ? Food that is high in trans fat, such as some fried foods. ? Food that is high in saturated fat, such as fatty meat. ? Desserts and other sweets, sugary drinks, and other foods with added sugar. ? Full-fat dairy products.  Do not salt foods before eating.  Do not eat more than 4 egg yolks a week.  Try to eat at least 2 vegetarian meals a week.  Eat more home-cooked food and less restaurant, buffet, and fast food.   Lifestyle  When eating at a restaurant, ask that your food be prepared with less salt or no salt, if possible.  If you drink alcohol: ? Limit how much you use to:  0-1 drink a day for women who are not pregnant.  0-2 drinks a day for men. ? Be aware of how much alcohol is in your drink. In the [place] one drink equals one 12 oz bottle of beer (355 mL), one 5 oz glass of wine (148 mL), or  one 1 oz glass of hard liquor (44 mL). General information  Avoid eating more than 2,300 mg of salt a day. If you have hypertension, you may need to reduce your sodium intake to 1,500 mg a day.  Work with your health care provider to maintain a healthy body weight or to lose weight. Ask what an ideal weight is for you.  Get at least 30 minutes of exercise that causes your heart to beat faster (aerobic exercise) most days of the week. Activities may include walking, swimming, or biking.  Work with your health care provider or  dietitian to adjust your eating plan to your individual calorie needs. What foods should I eat? Fruits All fresh, dried, or frozen fruit. Canned fruit in natural juice (without added sugar). Vegetables Fresh or frozen vegetables (raw, steamed, roasted, or grilled). Low-sodium or reduced-sodium tomato and vegetable juice. Low-sodium or reduced-sodium tomato sauce and tomato paste. Low-sodium or reduced-sodium canned vegetables. Grains Whole-grain or whole-wheat bread. Whole-grain or whole-wheat pasta. Brown rice. Orpah Cobb. Bulgur. Whole-grain and low-sodium cereals. Pita bread. Low-fat, low-sodium crackers. Whole-wheat flour tortillas. Meats and other proteins Skinless chicken or [place]. Ground chicken or [place]. Pork with fat trimmed off. Fish and seafood. Egg whites. Dried beans, peas, or lentils. Unsalted nuts, nut butters, and seeds. Unsalted canned beans. Lean cuts of beef with fat trimmed off. Low-sodium, lean precooked or cured meat, such as sausages or meat loaves. Dairy Low-fat (1%) or fat-free (skim) milk. Reduced-fat, low-fat, or fat-free cheeses. Nonfat, low-sodium ricotta or cottage cheese. Low-fat or nonfat yogurt. Low-fat, low-sodium cheese. Fats and oils Soft margarine without trans fats. Vegetable oil. Reduced-fat, low-fat, or light mayonnaise and salad dressings (reduced-sodium). Canola, safflower, olive, avocado, soybean, and sunflower oils. Avocado. Seasonings and condiments Herbs. Spices. Seasoning mixes without salt. Other foods Unsalted popcorn and pretzels. Fat-free sweets. The items listed above may not be a complete list of foods and beverages you can eat. Contact a dietitian for more information. What foods should I avoid? Fruits Canned fruit in a light or heavy syrup. Fried fruit. Fruit in cream or butter sauce. Vegetables Creamed or fried vegetables. Vegetables in a cheese sauce. Regular canned vegetables (not low-sodium or reduced-sodium). Regular canned  tomato sauce and paste (not low-sodium or reduced-sodium). Regular tomato and vegetable juice (not low-sodium or reduced-sodium). Rosita Fire. Olives. Grains Baked goods made with fat, such as croissants, muffins, or some breads. Dry pasta or rice meal packs. Meats and other proteins Fatty cuts of meat. Ribs. Fried meat. Tomasa Blase. Bologna, salami, and other precooked or cured meats, such as sausages or meat loaves. Fat from the back of a pig (fatback). Bratwurst. Salted nuts and seeds. Canned beans with added salt. Canned or smoked fish. Whole eggs or egg yolks. Chicken or [place] with skin. Dairy Whole or 2% milk, cream, and half-and-half. Whole or full-fat cream cheese. Whole-fat or sweetened yogurt. Full-fat cheese. Nondairy creamers. Whipped toppings. Processed cheese and cheese spreads. Fats and oils Butter. Stick margarine. Lard. Shortening. Ghee. Bacon fat. Tropical oils, such as coconut, palm kernel, or palm oil. Seasonings and condiments Onion salt, garlic salt, seasoned salt, table salt, and sea salt. Worcestershire sauce. Tartar sauce. Barbecue sauce. Teriyaki sauce. Soy sauce, including reduced-sodium. Steak sauce. Canned and packaged gravies. Fish sauce. Oyster sauce. Cocktail sauce. Store-bought horseradish. Ketchup. Mustard. Meat flavorings and tenderizers. Bouillon cubes. Hot sauces. Pre-made or packaged marinades. Pre-made or packaged taco seasonings. Relishes. Regular salad dressings. Other foods Salted popcorn and pretzels. The items listed above may not  be a complete list of foods and beverages you should avoid. Contact a dietitian for more information. Where to find more information  National Heart, Lung, and Blood Institute: PopSteam.is  American Heart Association: www.heart.org  Academy of Nutrition and Dietetics: www.eatright.org  National Kidney Foundation: www.kidney.org Summary  The DASH eating plan is a healthy eating plan that has been shown to reduce high blood  pressure (hypertension). It may also reduce your risk for type 2 diabetes, heart disease, and stroke.  When on the DASH eating plan, aim to eat more fresh fruits and vegetables, whole grains, lean proteins, low-fat dairy, and heart-healthy fats.  With the DASH eating plan, you should limit salt (sodium) intake to 2,300 mg a day. If you have hypertension, you may need to reduce your sodium intake to 1,500 mg a day.  Work with your health care provider or dietitian to adjust your eating plan to your individual calorie needs. This information is not intended to replace advice given to you by your health care provider. Make sure you discuss any questions you have with your health care provider. Document Revised: 10/17/2019 Document Reviewed: 10/17/2019 Elsevier Patient Education  2021 [employer].

## 2021-03-10 NOTE — Assessment & Plan Note (Signed)
Appears to be chronic - solitary wart Imiquimod cream Referred to Dermatology

## 2021-03-10 NOTE — Assessment & Plan Note (Signed)
BP Readings from Last 1 Encounters:  03/10/21 (!) 190/105   uncontrolled due to noncompliance Advised to start taking Lotrel as prescribed Counseled for compliance with the medications Advised DASH diet and moderate exercise/walking, at least 150 mins/wee

## 2021-03-10 NOTE — Assessment & Plan Note (Signed)
Needs to restart Atorvastatin Check lipid profile

## 2021-03-10 NOTE — Telephone Encounter (Signed)
error 

## 2021-03-10 NOTE — Assessment & Plan Note (Signed)
Care established Previous chart reviewed History and medications reviewed with the patient 

## 2021-03-10 NOTE — Assessment & Plan Note (Signed)
Smokes about 1.5 pack/day  Asked about quitting: confirms that he currently smokes cigarettes Advise to quit smoking: Educated about QUITTING to reduce the risk of cancer, cardio and cerebrovascular disease. Assess willingness: Unwilling to quit at this time, but is working on cutting back. Assist with counseling and pharmacotherapy: Counseled for 5 minutes and literature provided. Nicotine patch prescribed. Arrange for follow up: follow up in 3 months and continue to offer help.

## 2021-03-21 ENCOUNTER — Encounter: Payer: Self-pay | Admitting: Internal Medicine

## 2021-03-28 ENCOUNTER — Encounter: Payer: Self-pay | Admitting: Internal Medicine

## 2021-03-28 ENCOUNTER — Other Ambulatory Visit: Payer: Self-pay

## 2021-03-28 ENCOUNTER — Ambulatory Visit (INDEPENDENT_AMBULATORY_CARE_PROVIDER_SITE_OTHER): Payer: Medicare HMO | Admitting: Internal Medicine

## 2021-03-28 VITALS — BP 137/69 | HR 78 | Temp 98.6°F | Resp 18 | Ht 69.5 in | Wt 209.4 lb

## 2021-03-28 DIAGNOSIS — A6001 Herpesviral infection of penis: Secondary | ICD-10-CM | POA: Diagnosis not present

## 2021-03-28 DIAGNOSIS — A57 Chancroid: Secondary | ICD-10-CM

## 2021-03-28 MED ORDER — VALACYCLOVIR HCL 1 G PO TABS: 1000.0000 mg | ORAL_TABLET | Freq: Two times a day (BID) | ORAL | 0 refills | Status: AC

## 2021-03-28 MED ORDER — AZITHROMYCIN 500 MG PO TABS: 1000.0000 mg | ORAL_TABLET | Freq: Once | ORAL | 0 refills | Status: AC

## 2021-03-28 NOTE — Progress Notes (Signed)
Acute Office Visit  Subjective:    Patient ID: Brett Elliott, male    DOB: [birth year], 75 y.o.   MRN: [id number]  Chief Complaint  Patient presents with  . Penis Pain    Pt has been using aldara cream but since Thursday he has had red raw blister area on penis he thinks its an allergic reaction to medication     HPI Patient is in today for evaluation of blister over his penis. He was given Imiquimod for wart, which he applied over the whole penile area. He states that he noticed the blister for last 4 days and has been having itching in the genital area as well. Denies any urethral discharge. He has not been able to see Dermatologist yet. Denies any fever, chills, dysuria or hematuria.  Past Medical History:  Diagnosis Date  . Anxiety   . Brain aneurysm   . Hypertension     Past Surgical History:  Procedure Laterality Date  . BRAIN SURGERY    . CATARACT EXTRACTION W/ INTRAOCULAR LENS IMPLANT Bilateral   . IR RADIOLOGIST EVAL & MGMT  07/03/2017    History reviewed. No pertinent family history.  Social History   Socioeconomic History  . Marital status: Married    Spouse name: Not on file  . Number of children: Not on file  . Years of education: Not on file  . Highest education level: Not on file  Occupational History  . Not on file  Tobacco Use  . Smoking status: Current Every Day Smoker    Packs/day: 1.50    Types: Cigarettes  . Smokeless tobacco: Never Used  Substance and Sexual Activity  . Alcohol use: No  . Drug use: No  . Sexual activity: Not on file  Other Topics Concern  . Not on file  Social History Narrative  . Not on file   Social Determinants of Health   Financial Resource Strain: Not on file  Food Insecurity: Not on file  Transportation Needs: Not on file  Physical Activity: Not on file  Stress: Not on file  Social Connections: Not on file  Intimate Partner Violence: Not on file    Outpatient Medications Prior to Visit  Medication Sig  Dispense Refill  . amLODipine-benazepril (LOTREL) 10-40 MG capsule Take 1 capsule by mouth daily.    Marland Kitchen aspirin EC 325 MG tablet Take 325 mg by mouth daily.    Marland Kitchen atorvastatin (LIPITOR) 10 MG tablet Take 10 mg by mouth at bedtime.    . celecoxib (CELEBREX) 100 MG capsule Take 100 mg by mouth 2 (two) times daily as needed.    Marland Kitchen escitalopram (LEXAPRO) 10 MG tablet Take 1 tablet (10 mg total) by mouth daily. 30 tablet 2  . imiquimod (ALDARA) 5 % cream Apply topically 3 (three) times a week. 12 each 0  . nicotine (NICODERM CQ - DOSED IN MG/24 HOURS) 14 mg/24hr patch Place 1 patch (14 mg total) onto the skin daily. (Patient not taking: Reported on 03/28/2021) 28 patch 2   No facility-administered medications prior to visit.    Allergies  Allergen Reactions  . Cephalexin Hives and Rash    Review of Systems  Constitutional: Negative for chills, fatigue and fever.  Respiratory: Negative for cough and shortness of breath.   Gastrointestinal: Negative for constipation and diarrhea.  Genitourinary: Negative for dysuria, hematuria and penile pain.       Penile area blister       Objective:    Physical  Exam Vitals reviewed.  Constitutional:      General: He is not in acute distress.    Appearance: He is not diaphoretic.  HENT:     Head: Normocephalic and atraumatic.     Nose: Nose normal.     Mouth/Throat:     Mouth: Mucous membranes are moist.  Eyes:     General: No scleral icterus.    Extraocular Movements: Extraocular movements intact.  Cardiovascular:     Rate and Rhythm: Normal rate and regular rhythm.     Pulses: Normal pulses.     Heart sounds: Normal heart sounds. No murmur heard.   Pulmonary:     Breath sounds: Normal breath sounds. No wheezing or rales.  Abdominal:     Palpations: Abdomen is soft.     Tenderness: There is no abdominal tenderness.  Genitourinary:    Comments: Small vesicles on penile shaft and prepuce, 1 larger vesicle noted on posterior aspect of penile  shaft Musculoskeletal:     Cervical back: Neck supple. No tenderness.     Right lower leg: No edema.     Left lower leg: No edema.  Skin:    General: Skin is warm.     Findings: No rash.  Neurological:     General: No focal deficit present.     Mental Status: He is alert and oriented to person, place, and time.  Psychiatric:        Mood and Affect: Mood normal.        Behavior: Behavior normal.     BP 137/69 (BP Location: Right Arm, Patient Position: Sitting, Cuff Size: Normal)   Pulse 78   Temp 98.6 F (37 C) (Oral)   Resp 18   Ht 5' 9.5" (1.765 m)   Wt 209 lb 6.4 oz (95 kg)   SpO2 97%   BMI 30.48 kg/m  Wt Readings from Last 3 Encounters:  03/28/21 209 lb 6.4 oz (95 kg)  03/10/21 214 lb 6.4 oz (97.3 kg)  05/22/17 198 lb (89.8 kg)    Health Maintenance Due  Topic Date Due  . Hepatitis C Screening  Never done  . COVID-19 Vaccine (1) Never done  . COLONOSCOPY (Pts 45-91yrs Insurance coverage will need to be confirmed)  Never done  . PNA vac Low Risk Adult (1 of 2 - PCV13) Never done    There are no preventive care reminders to display for this patient.   No results found for: TSH Lab Results  Component Value Date   WBC 10.5 05/22/2017   HGB 13.4 05/22/2017   HCT 39.1 05/22/2017   MCV 91.1 05/22/2017   PLT 275 05/22/2017   Lab Results  Component Value Date   NA 140 05/22/2017   K 3.4 (L) 05/22/2017   CO2 27 05/22/2017   GLUCOSE 141 (H) 05/22/2017   BUN 17 05/22/2017   CREATININE 0.80 05/21/2019   BILITOT 0.5 05/22/2017   ALKPHOS 84 05/22/2017   AST 32 05/22/2017   ALT 18 05/22/2017   PROT 7.0 05/22/2017   ALBUMIN 4.0 05/22/2017   CALCIUM 9.2 05/22/2017   ANIONGAP 10 05/22/2017   No results found for: CHOL No results found for: HDL No results found for: LDLCALC No results found for: TRIG No results found for: CHOLHDL No results found for: QZES9Q     Assessment & Plan:   Problem List Items Addressed This Visit   None   Visit Diagnoses     Herpes simplex infection of penis    -  Primary Prescribed Valacyclovir considering small vesicles Advised to keep area clean and dry Will check for Dermatology referral   Relevant Medications   valACYclovir (VALTREX) 1000 MG tablet   azithromycin (ZITHROMAX) 500 MG tablet   Chancroid in male     Azithromycin 1 dose for possible bacterial coverage   Relevant Medications   valACYclovir (VALTREX) 1000 MG tablet   azithromycin (ZITHROMAX) 500 MG tablet       Meds ordered this encounter  Medications  . valACYclovir (VALTREX) 1000 MG tablet    Sig: Take 1 tablet (1,000 mg total) by mouth 2 (two) times daily for 10 days.    Dispense:  20 tablet    Refill:  0  . azithromycin (ZITHROMAX) 500 MG tablet    Sig: Take 2 tablets (1,000 mg total) by mouth once for 1 dose.    Dispense:  2 tablet    Refill:  0     Keyshla Tunison Concha Se, MD

## 2021-03-28 NOTE — Patient Instructions (Signed)
[email or web address]">  Genital Herpes Genital herpes is a common sexually transmitted infection (STI) that is caused by a virus. The virus spreads from person to person through sexual contact. The infection can cause itching, blisters, and sores around the genitals or rectum. Symptoms may last for several days and then go away. This is called an outbreak. The virus remains in the body, however, so more outbreaks may happen in the future. The time between outbreaks varies and can be from months to years. Genital herpes can affect anyone. It is particularly concerning for pregnant women because the virus can be passed to the baby during delivery and can cause serious problems. Genital herpes is also a concern for people who have a weak disease-fighting system (immune system). What are the causes? This condition is caused by the human herpesvirus, also called herpes simplex virus, type 1 or type 2 (HSV-1 or HSV-2). The virus may spread through:  Sexual contact with an infected person, including vaginal, anal, and oral sex.  Contact with fluid from a herpes sore.  The skin. This means that you can get herpes from an infected partner even if there are no blisters or sores present. Your partner may not know that he or she is infected. What increases the risk? You are more likely to develop this condition if:  You have sex with many partners.  You do not use latex condoms during sex. What are the signs or symptoms? Most people do not have symptoms (are asymptomatic), or they have mild symptoms that may be mistaken for other skin problems. Symptoms may include:  Small, red bumps near the genitals, rectum, or mouth. These bumps turn into blisters and then sores.  Flu-like symptoms, including: ? Fever. ? Body aches. ? Swollen lymph nodes. ? Headache.  Painful urination.  Pain and itching in the genital area or rectal  area.  Vaginal discharge.  Tingling or shooting pain in the legs and buttocks. Generally, symptoms are more severe and last longer during the first (primary) outbreak. Flu-like symptoms are also more common during the primary outbreak.   How is this diagnosed? This condition may be diagnosed based on:  A physical exam.  Your medical history.  Blood tests.  A test of a fluid sample (culture) from an open sore. How is this treated? There is no cure for this condition, but treatment with antiviral medicines that are taken by mouth (orally) can do the following:  Speed up healing and relieve symptoms.  Help to reduce the spread of the virus to sexual partners.  Limit the chance of future outbreaks, or make future outbreaks shorter.  Lessen symptoms of future outbreaks. Your health care provider may also recommend pain relief medicines, such as aspirin or ibuprofen. Follow these instructions at home: If you have an outbreak:  Keep the affected areas dry and clean.  Avoid rubbing or touching blisters and sores. If you do touch blisters or sores: ? Wash your hands thoroughly with soap and water. ? Do not touch your eyes afterward.  To help relieve pain or itching, you may take the following actions as told by your health care provider: ? Apply a cold, wet cloth (cold compress) to affected areas 4-6 times a day. ? Apply a substance that protects your skin and reduces bleeding (astringent). ? Apply a gel that helps relieve pain around sores (lidocaine gel). ? Take a warm, shallow bath that cleans the genital area (sitz bath). Sexual activity  Do not have sexual contact during   active outbreaks.  Practice safe sex. Herpes can spread even if your partner does not have blisters or sores. Latex condoms and male condoms may help prevent the spread of the herpes virus. General instructions  Take over-the-counter and prescription medicines only as told by your health care  provider.  Keep all follow-up visits as told by your health care provider. This is important. How is this prevented?  Use condoms. Although you can get genital herpes during sexual contact even with the use of a condom, a condom can provide some protection.  Avoid having multiple sexual partners.  Talk with your sexual partner about any symptoms either of you may have. Also, talk with your partner about any history of STIs.  Get tested for STIs before you have sex. Ask your partner to do the same.  Do not have sexual contact if you have active symptoms of genital herpes. Contact a health care provider if:  Your symptoms are not improving with medicine.  Your symptoms return, or you have new symptoms.  You have a fever.  You have abdominal pain.  You have redness, swelling, or pain in your eye.  You notice new sores on other parts of your body.  You are a woman and you experience bleeding between menstrual periods.  You have had herpes and you become pregnant or plan to become pregnant. Summary  Genital herpes is a common sexually transmitted infection (STI) that is caused by the herpes simplex virus, type 1 or type 2 (HSV-1 or HSV-2).  These viruses are most often spread through sexual contact with an infected person.  You are more likely to develop this condition if you have sex with many partners or you do not use condoms during sex.  Most people do not have symptoms (are asymptomatic) or have mild symptoms that may be mistaken for other skin problems. Symptoms occur as outbreaks that may happen months or years apart.  There is no cure for this condition, but treatment with oral antiviral medicines can reduce symptoms, reduce the chance of spreading the virus to a partner, prevent future outbreaks, or shorten future outbreaks. This information is not intended to replace advice given to you by your health care provider. Make sure you discuss any questions you have with your  health care provider. Document Revised: 07/24/2019 Document Reviewed: 07/24/2019 Elsevier Patient Education  2021 Elsevier Inc.  

## 2021-03-30 LAB — LIPID PANEL
Chol/HDL Ratio: 4.6 ratio (ref 0.0–5.0)
Cholesterol, Total: 147 mg/dL (ref 100–199)
HDL: 32 mg/dL — ABNORMAL LOW (ref >39)
LDL Chol Calc (NIH): 87 mg/dL (ref 0–99)
Triglycerides: 162 mg/dL — ABNORMAL HIGH (ref 0–149)
VLDL Cholesterol Cal: 28 mg/dL (ref 5–40)

## 2021-03-30 LAB — CMP14+EGFR
ALT: 12 IU/L (ref 0–44)
AST: 23 IU/L (ref 0–40)
Albumin/Globulin Ratio: 1.7 (ref 1.2–2.2)
Albumin: 4.2 g/dL (ref 3.7–4.7)
Alkaline Phosphatase: 95 IU/L (ref 44–121)
BUN/Creatinine Ratio: 12 (ref 10–24)
BUN: 11 mg/dL (ref 8–27)
Bilirubin Total: 0.3 mg/dL (ref 0.0–1.2)
CO2: 23 mmol/L (ref 20–29)
Calcium: 9.2 mg/dL (ref 8.6–10.2)
Chloride: 99 mmol/L (ref 96–106)
Creatinine, Ser: 0.94 mg/dL (ref 0.76–1.27)
Globulin, Total: 2.5 g/dL (ref 1.5–4.5)
Glucose: 103 mg/dL — ABNORMAL HIGH (ref 65–99)
Potassium: 4.2 mmol/L (ref 3.5–5.2)
Sodium: 140 mmol/L (ref 134–144)
Total Protein: 6.7 g/dL (ref 6.0–8.5)
eGFR: 85 mL/min/{1.73_m2} (ref >59)

## 2021-03-30 LAB — CBC WITH DIFFERENTIAL/PLATELET
Basophils Absolute: 0 10*3/uL (ref 0.0–0.2)
Basos: 1 %
EOS (ABSOLUTE): 0.1 10*3/uL (ref 0.0–0.4)
Eos: 1 %
Hematocrit: 47.7 % (ref 37.5–51.0)
Hemoglobin: 15.7 g/dL (ref 13.0–17.7)
Immature Grans (Abs): 0 10*3/uL (ref 0.0–0.1)
Immature Granulocytes: 1 %
Lymphocytes Absolute: 2.5 10*3/uL (ref 0.7–3.1)
Lymphs: 37 %
MCH: 30.4 pg (ref 26.6–33.0)
MCHC: 32.9 g/dL (ref 31.5–35.7)
MCV: 92 fL (ref 79–97)
Monocytes Absolute: 0.8 10*3/uL (ref 0.1–0.9)
Monocytes: 12 %
Neutrophils Absolute: 3.2 10*3/uL (ref 1.4–7.0)
Neutrophils: 48 %
Platelets: 205 10*3/uL (ref 150–450)
RBC: 5.16 x10E6/uL (ref 4.14–5.80)
RDW: 14.2 % (ref 11.6–15.4)
WBC: 6.6 10*3/uL (ref 3.4–10.8)

## 2021-03-30 LAB — TSH+FREE T4
Free T4: 1.12 ng/dL (ref 0.82–1.77)
TSH: 1.05 u[IU]/mL (ref 0.450–4.500)

## 2021-03-30 LAB — HEPATITIS C ANTIBODY: Hep C Virus Ab: <0.1 s/co ratio (ref 0.0–0.9)

## 2021-03-30 LAB — VITAMIN D 25 HYDROXY (VIT D DEFICIENCY, FRACTURES): Vit D, 25-Hydroxy: 47.9 ng/mL (ref 30.0–100.0)

## 2021-03-30 LAB — HEMOGLOBIN A1C
Est. average glucose Bld gHb Est-mCnc: 120 mg/dL
Hgb A1c MFr Bld: 5.8 % — ABNORMAL HIGH (ref 4.8–5.6)

## 2021-04-07 ENCOUNTER — Ambulatory Visit: Payer: Medicare HMO | Admitting: Internal Medicine

## 2021-05-23 ENCOUNTER — Telehealth: Payer: Self-pay

## 2021-05-23 ENCOUNTER — Other Ambulatory Visit: Payer: Self-pay

## 2021-05-23 MED ORDER — AMLODIPINE BESY-BENAZEPRIL HCL 10-40 MG PO CAPS: 1.0000 | ORAL_CAPSULE | Freq: Every day | ORAL | 3 refills | Status: DC

## 2021-05-23 NOTE — Telephone Encounter (Signed)
Refill sent.

## 2021-05-23 NOTE — Telephone Encounter (Signed)
Please send rx in to pharmacy amLODipine-benazepril (LOTREL) 10-40 MG capsule

## 2021-05-25 ENCOUNTER — Other Ambulatory Visit: Payer: Self-pay

## 2021-05-25 MED ORDER — AMLODIPINE BESY-BENAZEPRIL HCL 10-40 MG PO CAPS: 1.0000 | ORAL_CAPSULE | Freq: Every day | ORAL | 3 refills | Status: DC

## 2021-05-25 NOTE — Telephone Encounter (Signed)
Took CA out and sent to [facility] in [place]

## 2021-05-25 NOTE — Telephone Encounter (Signed)
Pt does not use [facility], onlyu [facility] in [place]

## 2021-06-20 ENCOUNTER — Ambulatory Visit: Payer: Medicare HMO | Admitting: Gastroenterology

## 2021-06-28 ENCOUNTER — Ambulatory Visit (INDEPENDENT_AMBULATORY_CARE_PROVIDER_SITE_OTHER): Payer: Medicare HMO | Admitting: Internal Medicine

## 2021-06-28 ENCOUNTER — Encounter: Payer: Self-pay | Admitting: Internal Medicine

## 2021-06-28 ENCOUNTER — Other Ambulatory Visit: Payer: Self-pay

## 2021-06-28 ENCOUNTER — Telehealth: Payer: Self-pay

## 2021-06-28 VITALS — BP 168/79 | HR 83 | Resp 18 | Ht 68.0 in | Wt 210.1 lb

## 2021-06-28 DIAGNOSIS — F419 Anxiety disorder, unspecified: Secondary | ICD-10-CM | POA: Diagnosis not present

## 2021-06-28 DIAGNOSIS — F17210 Nicotine dependence, cigarettes, uncomplicated: Secondary | ICD-10-CM | POA: Diagnosis not present

## 2021-06-28 DIAGNOSIS — Z720 Tobacco use: Secondary | ICD-10-CM

## 2021-06-28 DIAGNOSIS — E782 Mixed hyperlipidemia: Secondary | ICD-10-CM

## 2021-06-28 DIAGNOSIS — I10 Essential (primary) hypertension: Secondary | ICD-10-CM | POA: Diagnosis not present

## 2021-06-28 MED ORDER — OLMESARTAN-AMLODIPINE-HCTZ 40-10-12.5 MG PO TABS: 1.0000 | ORAL_TABLET | Freq: Every day | ORAL | 0 refills | Status: DC

## 2021-06-28 NOTE — Assessment & Plan Note (Signed)
Smokes about 1.5 pack/day  Asked about quitting: confirms that he currently smokes cigarettes Advise to quit smoking: Educated about QUITTING to reduce the risk of cancer, cardio and cerebrovascular disease. Assess willingness: Unwilling to quit at this time, but is working on cutting back. Assist with counseling and pharmacotherapy: Counseled for 5 minutes and literature provided. Nicotine patch prescribed. Arrange for follow up: follow up in 3 months and continue to offer help.

## 2021-06-28 NOTE — Telephone Encounter (Signed)
Patient changing his future pharmacy to [facility]

## 2021-06-28 NOTE — Assessment & Plan Note (Signed)
Uncontrolled, needs to take Lexapro regularly

## 2021-06-28 NOTE — Progress Notes (Signed)
Established Patient Office Visit  Subjective:  Patient ID: Brett Elliott, male    DOB: 01-25-1946  Age: 75 y.o. MRN: [id number]  CC:  Chief Complaint  Patient presents with   Follow-up    3 month follow up     HPI Brett Elliott is a 75 year old male with PMH of hypertension, hyperlipidemia, anxiety, tobacco abuse, osteoarthritis and chronic genital wart who presents for follow up of his chronic medical conditions.  HTN: Uncontrolled with Lotrel. Compliance is questionable. Patient denies headache, dizziness, chest pain, dyspnea or palpitations.  He has feeling anxious and states that he stopped taking Lexapro after 1 day. He understands that he needs to take it for at least 4 weeks to see the effect and agrees to take it for now.  Continues to smoke about 1.5 pack/day.   Past Medical History:  Diagnosis Date   Anxiety    Brain aneurysm    Hypertension     Past Surgical History:  Procedure Laterality Date   BRAIN SURGERY     CATARACT EXTRACTION W/ INTRAOCULAR LENS IMPLANT Bilateral    IR RADIOLOGIST EVAL & MGMT  07/03/2017    History reviewed. No pertinent family history.  Social History   Socioeconomic History   Marital status: Married    Spouse name: Not on file   Number of children: Not on file   Years of education: Not on file   Highest education level: Not on file  Occupational History   Not on file  Tobacco Use   Smoking status: Every Day    Packs/day: 1.50    Types: Cigarettes   Smokeless tobacco: Never  Substance and Sexual Activity   Alcohol use: No   Drug use: No   Sexual activity: Not on file  Other Topics Concern   Not on file  Social History Narrative   Not on file   Social Determinants of Health   Financial Resource Strain: Not on file  Food Insecurity: Not on file  Transportation Needs: Not on file  Physical Activity: Not on file  Stress: Not on file  Social Connections: Not on file  Intimate Partner Violence: Not on file     Outpatient Medications Prior to Visit  Medication Sig Dispense Refill   aspirin EC 325 MG tablet Take 325 mg by mouth daily.     atorvastatin (LIPITOR) 10 MG tablet Take 10 mg by mouth at bedtime.     celecoxib (CELEBREX) 100 MG capsule Take 100 mg by mouth 2 (two) times daily as needed.     escitalopram (LEXAPRO) 10 MG tablet Take 1 tablet (10 mg total) by mouth daily. 30 tablet 2   imiquimod (ALDARA) 5 % cream Apply topically 3 (three) times a week. 12 each 0   amLODipine-benazepril (LOTREL) 10-40 MG capsule Take 1 capsule by mouth daily. 30 capsule 3   nicotine (NICODERM CQ - DOSED IN MG/24 HOURS) 14 mg/24hr patch Place 1 patch (14 mg total) onto the skin daily. (Patient not taking: Reported on 06/28/2021) 28 patch 2   No facility-administered medications prior to visit.    Allergies  Allergen Reactions   Cephalexin Hives and Rash    ROS Review of Systems  Constitutional:  Negative for chills and fever.  HENT:  Negative for congestion and sore throat.   Eyes:  Negative for pain and discharge.  Respiratory:  Negative for cough and shortness of breath.   Cardiovascular:  Negative for chest pain and palpitations.  Gastrointestinal:  Negative  for constipation, diarrhea, nausea and vomiting.  Endocrine: Negative for polydipsia and polyuria.  Genitourinary:  Negative for dysuria and hematuria.  Musculoskeletal:  Negative for neck pain and neck stiffness.  Skin:  Negative for rash.       Wart  Neurological:  Negative for dizziness, weakness, numbness and headaches.  Psychiatric/Behavioral:  Negative for agitation and behavioral problems.      Objective:    Physical Exam Vitals reviewed.  Constitutional:      General: He is not in acute distress.    Appearance: He is not diaphoretic.  HENT:     Head: Normocephalic and atraumatic.     Nose: Nose normal.     Mouth/Throat:     Mouth: Mucous membranes are moist.  Eyes:     General: No scleral icterus.    Extraocular  Movements: Extraocular movements intact.     Pupils: Pupils are equal, round, and reactive to light.  Cardiovascular:     Rate and Rhythm: Normal rate and regular rhythm.     Pulses: Normal pulses.     Heart sounds: Normal heart sounds. No murmur heard. Pulmonary:     Breath sounds: Normal breath sounds. No wheezing or rales.  Abdominal:     Palpations: Abdomen is soft.     Tenderness: There is no abdominal tenderness.  Musculoskeletal:     Cervical back: Neck supple. No tenderness.     Right lower leg: No edema.     Left lower leg: No edema.  Skin:    General: Skin is warm.     Findings: No rash.  Neurological:     General: No focal deficit present.     Mental Status: He is alert and oriented to person, place, and time.  Psychiatric:        Mood and Affect: Mood normal.        Behavior: Behavior normal.    BP (!) 168/79 (BP Location: Left Arm, Patient Position: Sitting, Cuff Size: Normal)   Pulse 83   Resp 18   Ht '5\' 8"'  (1.727 m)   Wt 210 lb 1.9 oz (95.3 kg)   SpO2 94%   BMI 31.95 kg/m  Wt Readings from Last 3 Encounters:  06/28/21 210 lb 1.9 oz (95.3 kg)  03/28/21 209 lb 6.4 oz (95 kg)  03/10/21 214 lb 6.4 oz (97.3 kg)     Health Maintenance Due  Topic Date Due   Zoster Vaccines- Shingrix (1 of 2) Never done   COLONOSCOPY (Pts 45-40yr Insurance coverage will need to be confirmed)  Never done   PNA vac Low Risk Adult (1 of 2 - PCV13) Never done   COVID-19 Vaccine (3 - Moderna risk series) 03/15/2020   INFLUENZA VACCINE  06/27/2021    There are no preventive care reminders to display for this patient.  Lab Results  Component Value Date   TSH 1.050 03/28/2021   Lab Results  Component Value Date   WBC 6.6 03/28/2021   HGB 15.7 03/28/2021   HCT 47.7 03/28/2021   MCV 92 03/28/2021   PLT 205 03/28/2021   Lab Results  Component Value Date   NA 140 03/28/2021   K 4.2 03/28/2021   CO2 23 03/28/2021   GLUCOSE 103 (H) 03/28/2021   BUN 11 03/28/2021    CREATININE 0.94 03/28/2021   BILITOT 0.3 03/28/2021   ALKPHOS 95 03/28/2021   AST 23 03/28/2021   ALT 12 03/28/2021   PROT 6.7 03/28/2021   ALBUMIN 4.2 03/28/2021  CALCIUM 9.2 03/28/2021   ANIONGAP 10 05/22/2017   EGFR 85 03/28/2021   Lab Results  Component Value Date   CHOL 147 03/28/2021   Lab Results  Component Value Date   HDL 32 (L) 03/28/2021   Lab Results  Component Value Date   LDLCALC 87 03/28/2021   Lab Results  Component Value Date   TRIG 162 (H) 03/28/2021   Lab Results  Component Value Date   CHOLHDL 4.6 03/28/2021   Lab Results  Component Value Date   HGBA1C 5.8 (H) 03/28/2021      Assessment & Plan:   Problem List Items Addressed This Visit       Cardiovascular and Mediastinum   Hypertension - Primary    BP Readings from Last 1 Encounters:  06/28/21 (!) 168/79  uncontrolled with Lotrel Switched to Olmesartan-Amlodipine-HCTZ 40-10-12.5 mg QD Counseled for compliance with the medications Advised DASH diet and moderate exercise/walking, at least 150 mins/wee Needs to quit smoking and be compliant to medications      Relevant Medications   Olmesartan-amLODIPine-HCTZ 40-10-12.5 MG TABS     Other   Tobacco abuse    Smokes about 1.5 pack/day  Asked about quitting: confirms that he currently smokes cigarettes Advise to quit smoking: Educated about QUITTING to reduce the risk of cancer, cardio and cerebrovascular disease. Assess willingness: Unwilling to quit at this time, but is working on cutting back. Assist with counseling and pharmacotherapy: Counseled for 5 minutes and literature provided. Nicotine patch prescribed. Arrange for follow up: follow up in 3 months and continue to offer help.       Anxiety    Uncontrolled, needs to take Lexapro regularly        Meds ordered this encounter  Medications   Olmesartan-amLODIPine-HCTZ 40-10-12.5 MG TABS    Sig: Take 1 tablet by mouth daily.    Dispense:  30 tablet    Refill:  0     Follow-up: Return in about 4 weeks (around 07/26/2021) for HTN.    Lindell Spar, MD

## 2021-06-28 NOTE — Telephone Encounter (Signed)
Changed in system already

## 2021-06-28 NOTE — Assessment & Plan Note (Signed)
Continue Atorvastatin Lipid profile reviewed

## 2021-06-28 NOTE — Patient Instructions (Signed)
Please start taking Olmesartan-Amlodipine-HCTZ instead of Amlodipine-Benazepril.  Please continue taking other medications as prescribed.  Please follow DASH diet and ambulate as tolerated.  Please cut down -> quit smoking soon.

## 2021-06-28 NOTE — Assessment & Plan Note (Signed)
BP Readings from Last 1 Encounters:  06/28/21 (!) 168/79   uncontrolled with Lotrel Switched to Olmesartan-Amlodipine-HCTZ 40-10-12.5 mg QD Counseled for compliance with the medications Advised DASH diet and moderate exercise/walking, at least 150 mins/wee Needs to quit smoking and be compliant to medications

## 2021-07-28 ENCOUNTER — Other Ambulatory Visit: Payer: Self-pay

## 2021-07-28 ENCOUNTER — Encounter: Payer: Self-pay | Admitting: Internal Medicine

## 2021-07-28 ENCOUNTER — Ambulatory Visit (INDEPENDENT_AMBULATORY_CARE_PROVIDER_SITE_OTHER): Payer: Medicare HMO | Admitting: Internal Medicine

## 2021-07-28 VITALS — BP 162/82 | HR 75 | Temp 97.8°F | Resp 18 | Ht 68.0 in | Wt 214.0 lb

## 2021-07-28 DIAGNOSIS — R519 Headache, unspecified: Secondary | ICD-10-CM

## 2021-07-28 DIAGNOSIS — I10 Essential (primary) hypertension: Secondary | ICD-10-CM

## 2021-07-28 DIAGNOSIS — Z9889 Other specified postprocedural states: Secondary | ICD-10-CM

## 2021-07-28 DIAGNOSIS — Z8679 Personal history of other diseases of the circulatory system: Secondary | ICD-10-CM

## 2021-07-28 DIAGNOSIS — Z23 Encounter for immunization: Secondary | ICD-10-CM | POA: Diagnosis not present

## 2021-07-28 DIAGNOSIS — F419 Anxiety disorder, unspecified: Secondary | ICD-10-CM

## 2021-07-28 DIAGNOSIS — G8929 Other chronic pain: Secondary | ICD-10-CM

## 2021-07-28 DIAGNOSIS — I209 Angina pectoris, unspecified: Secondary | ICD-10-CM

## 2021-07-28 MED ORDER — OLMESARTAN-AMLODIPINE-HCTZ 20-5-12.5 MG PO TABS: 1.0000 | ORAL_TABLET | Freq: Every day | ORAL | 2 refills | Status: DC

## 2021-07-28 MED ORDER — CARVEDILOL 6.25 MG PO TABS: 6.2500 mg | ORAL_TABLET | Freq: Two times a day (BID) | ORAL | 3 refills | Status: DC

## 2021-07-28 NOTE — Patient Instructions (Signed)
Please start taking Coreg as prescribed.  You are being referred to Cardiology for evaluation of chest pain.  You are being scheduled to get MRI of brain.

## 2021-07-29 DIAGNOSIS — I209 Angina pectoris, unspecified: Secondary | ICD-10-CM | POA: Insufficient documentation

## 2021-07-29 DIAGNOSIS — R519 Headache, unspecified: Secondary | ICD-10-CM | POA: Insufficient documentation

## 2021-07-29 DIAGNOSIS — G8929 Other chronic pain: Secondary | ICD-10-CM | POA: Insufficient documentation

## 2021-07-29 DIAGNOSIS — Z8679 Personal history of other diseases of the circulatory system: Secondary | ICD-10-CM | POA: Insufficient documentation

## 2021-07-29 NOTE — Assessment & Plan Note (Signed)
Headache likely due to uncontrolled HTN and anxiety He has h/o R MCA aneurysm s/p endovascular repair Obtain MRI brain to check for patency of stent If headaches persistent, will refer to Neurology

## 2021-07-29 NOTE — Progress Notes (Signed)
Established Patient Office Visit  Subjective:  Patient ID: Brett Elliott, male    DOB: 10/28/1946  Age: 75 y.o. MRN: [id number]  CC:  Chief Complaint  Patient presents with   Follow-up    4 week follow up pt has been cutting olmesartan in half as when he is taking a whole he feels like he cannot do anything was on lisinopril and carvedilol awhile back and felt normal but was taken off of these     HPI Brett Elliott is a 75 year old male with PMH of hypertension, cerebral aneurysm s/p endovascular coiling and hyperlipidemia, anxiety, tobacco abuse, osteoarthritis and chronic genital wart who presents for follow up of his chronic medical conditions. His wife is present during the visit.  HTN: BP is uncontrolled. He has been taking half dose of his Olmesartan-Amlodipine-HCTZ as he was having fatigue and dizziness with 1 QD dose. His wife states that patient c/o chest pain at home at times, but patient states that it is due to anxiety/stress. Denies any dyspnea or palpitations.  He continues to have anxiety and states that it is due to noise at his home by his Grandchildren. He has not been taking Lexapro yet. Denies any anhedonia, SI or HI.  He has been having chronic, generalized headache. He has h/o right MCA aneurysm, which had been repaired with stent assisted coiling. He used to follow up IR for it, but has not had Neurology evaluation recently.  He received flu vaccine in the office today.  Past Medical History:  Diagnosis Date   Anxiety    Brain aneurysm    Hypertension     Past Surgical History:  Procedure Laterality Date   BRAIN SURGERY     CATARACT EXTRACTION W/ INTRAOCULAR LENS IMPLANT Bilateral    IR RADIOLOGIST EVAL & MGMT  07/03/2017    History reviewed. No pertinent family history.  Social History   Socioeconomic History   Marital status: Married    Spouse name: Not on file   Number of children: Not on file   Years of education: Not on file   Highest  education level: Not on file  Occupational History   Not on file  Tobacco Use   Smoking status: Every Day    Packs/day: 1.50    Types: Cigarettes   Smokeless tobacco: Never  Substance and Sexual Activity   Alcohol use: No   Drug use: No   Sexual activity: Not on file  Other Topics Concern   Not on file  Social History Narrative   Not on file   Social Determinants of Health   Financial Resource Strain: Not on file  Food Insecurity: Not on file  Transportation Needs: Not on file  Physical Activity: Not on file  Stress: Not on file  Social Connections: Not on file  Intimate Partner Violence: Not on file    Outpatient Medications Prior to Visit  Medication Sig Dispense Refill   aspirin EC 325 MG tablet Take 325 mg by mouth daily.     atorvastatin (LIPITOR) 10 MG tablet Take 10 mg by mouth at bedtime.     celecoxib (CELEBREX) 100 MG capsule Take 100 mg by mouth 2 (two) times daily as needed.     imiquimod (ALDARA) 5 % cream Apply topically 3 (three) times a week. 12 each 0   Olmesartan-amLODIPine-HCTZ 40-10-12.5 MG TABS Take 1 tablet by mouth daily. 30 tablet 0   escitalopram (LEXAPRO) 10 MG tablet Take 1 tablet (10 mg total) by  mouth daily. (Patient not taking: Reported on 07/28/2021) 30 tablet 2   nicotine (NICODERM CQ - DOSED IN MG/24 HOURS) 14 mg/24hr patch Place 1 patch (14 mg total) onto the skin daily. (Patient not taking: Reported on 07/28/2021) 28 patch 2   No facility-administered medications prior to visit.    Allergies  Allergen Reactions   Cephalexin Hives and Rash    ROS Review of Systems  Constitutional:  Negative for chills and fever.  HENT:  Negative for congestion and sore throat.   Eyes:  Negative for pain and discharge.  Respiratory:  Negative for cough and shortness of breath.   Cardiovascular:  Negative for chest pain and palpitations.  Gastrointestinal:  Negative for constipation, diarrhea, nausea and vomiting.  Endocrine: Negative for polydipsia  and polyuria.  Genitourinary:  Negative for dysuria and hematuria.  Musculoskeletal:  Negative for neck pain and neck stiffness.  Skin:  Negative for rash.       Wart  Neurological:  Positive for headaches. Negative for dizziness, weakness and numbness.  Psychiatric/Behavioral:  Positive for sleep disturbance. Negative for agitation and behavioral problems. The patient is nervous/anxious.      Objective:    Physical Exam Vitals reviewed.  Constitutional:      General: He is not in acute distress.    Appearance: He is not diaphoretic.  HENT:     Head: Normocephalic and atraumatic.     Nose: Nose normal.     Mouth/Throat:     Mouth: Mucous membranes are moist.  Eyes:     General: No scleral icterus.    Extraocular Movements: Extraocular movements intact.  Cardiovascular:     Rate and Rhythm: Normal rate and regular rhythm.     Pulses: Normal pulses.     Heart sounds: Normal heart sounds. No murmur heard. Pulmonary:     Breath sounds: Normal breath sounds. No wheezing or rales.  Musculoskeletal:     Cervical back: Neck supple. No tenderness.     Right lower leg: No edema.     Left lower leg: No edema.  Skin:    General: Skin is warm.     Findings: No rash.  Neurological:     General: No focal deficit present.     Mental Status: He is alert and oriented to person, place, and time.     Sensory: No sensory deficit.     Motor: No weakness.  Psychiatric:        Mood and Affect: Mood normal.        Behavior: Behavior normal.    BP (!) 162/82 (BP Location: Left Arm, Patient Position: Sitting, Cuff Size: Normal)   Pulse 75   Temp 97.8 F (36.6 C) (Oral)   Resp 18   Ht '5\' 8"'  (1.727 m)   Wt 214 lb (97.1 kg)   SpO2 98%   BMI 32.54 kg/m  Wt Readings from Last 3 Encounters:  07/28/21 214 lb (97.1 kg)  06/28/21 210 lb 1.9 oz (95.3 kg)  03/28/21 209 lb 6.4 oz (95 kg)     Health Maintenance Due  Topic Date Due   Zoster Vaccines- Shingrix (1 of 2) Never done    COLONOSCOPY (Pts 45-76yr Insurance coverage will need to be confirmed)  Never done   PNA vac Low Risk Adult (1 of 2 - PCV13) Never done   COVID-19 Vaccine (3 - Moderna risk series) 03/15/2020    There are no preventive care reminders to display for this patient.  Lab Results  Component Value Date   TSH 1.050 03/28/2021   Lab Results  Component Value Date   WBC 6.6 03/28/2021   HGB 15.7 03/28/2021   HCT 47.7 03/28/2021   MCV 92 03/28/2021   PLT 205 03/28/2021   Lab Results  Component Value Date   NA 140 03/28/2021   K 4.2 03/28/2021   CO2 23 03/28/2021   GLUCOSE 103 (H) 03/28/2021   BUN 11 03/28/2021   CREATININE 0.94 03/28/2021   BILITOT 0.3 03/28/2021   ALKPHOS 95 03/28/2021   AST 23 03/28/2021   ALT 12 03/28/2021   PROT 6.7 03/28/2021   ALBUMIN 4.2 03/28/2021   CALCIUM 9.2 03/28/2021   ANIONGAP 10 05/22/2017   EGFR 85 03/28/2021   Lab Results  Component Value Date   CHOL 147 03/28/2021   Lab Results  Component Value Date   HDL 32 (L) 03/28/2021   Lab Results  Component Value Date   LDLCALC 87 03/28/2021   Lab Results  Component Value Date   TRIG 162 (H) 03/28/2021   Lab Results  Component Value Date   CHOLHDL 4.6 03/28/2021   Lab Results  Component Value Date   HGBA1C 5.8 (H) 03/28/2021      Assessment & Plan:   Problem List Items Addressed This Visit       Cardiovascular and Mediastinum   Hypertension - Primary    BP Readings from Last 1 Encounters:  07/28/21 (!) 162/82  uncontrolled with Olmesartan-Amlodipine-HCTZ, decreased dose and added Coreg 6.25 mg BID considering his risk for CAD Counseled for compliance with the medications Advised DASH diet and moderate exercise/walking, at least 150 mins/wee Needs to quit smoking and be compliant to medications      Relevant Medications   carvedilol (COREG) 6.25 MG tablet   Olmesartan-amLODIPine-HCTZ 20-5-12.5 MG TABS   Angina pectoris (HCC)    Atypical chest pain, not related to  exertion Since he has h/o uncontrolled HTN, referred to Cardiology for possible stress test  EKG: Sinus rhythm with PAC. No signs of active ischemia.  Started Coreg for now to add antihypertensive medication      Relevant Medications   carvedilol (COREG) 6.25 MG tablet   Olmesartan-amLODIPine-HCTZ 20-5-12.5 MG TABS   Other Relevant Orders   Ambulatory referral to Cardiology   EKG 12-Lead (Completed)     Other   Anxiety    Uncontrolled, needs to take Lexapro regularly      Chronic nonintractable headache    Headache likely due to uncontrolled HTN and anxiety He has h/o R MCA aneurysm s/p endovascular repair Obtain MRI brain to check for patency of stent If headaches persistent, will refer to Neurology      Relevant Medications   carvedilol (COREG) 6.25 MG tablet   Other Relevant Orders   MR Brain Wo Contrast   History of cerebral aneurysm repair   Other Visit Diagnoses     Need for immunization against influenza     Patient was educated on the recommendation for flu vaccine. After obtaining informed consent, the vaccine was administered no adverse effects noted at time of administration. Patient provided with education on arm soreness and use of tylenol or ibuprofen (if safe) for this. Encourage to use the arm vaccine was given in to help reduce the soreness. Patient educated on the signs of a reaction to the vaccine and advised to contact the office should these occur.    Relevant Orders   Flu Vaccine QUAD High Dose(Fluad) (Completed)  Meds ordered this encounter  Medications   carvedilol (COREG) 6.25 MG tablet    Sig: Take 1 tablet (6.25 mg total) by mouth 2 (two) times daily with a meal.    Dispense:  60 tablet    Refill:  3   Olmesartan-amLODIPine-HCTZ 20-5-12.5 MG TABS    Sig: Take 1 tablet by mouth daily.    Dispense:  30 tablet    Refill:  2    Follow-up: Return in about 3 months (around 10/27/2021) for HTN.    Lindell Spar, MD

## 2021-07-29 NOTE — Assessment & Plan Note (Signed)
Atypical chest pain, not related to exertion Since he has h/o uncontrolled HTN, referred to Cardiology for possible stress test  EKG: Sinus rhythm with PAC. No signs of active ischemia.  Started Coreg for now to add antihypertensive medication

## 2021-07-29 NOTE — Assessment & Plan Note (Signed)
Uncontrolled, needs to take Lexapro regularly

## 2021-07-29 NOTE — Assessment & Plan Note (Signed)
BP Readings from Last 1 Encounters:  07/28/21 (!) 162/82   uncontrolled with Olmesartan-Amlodipine-HCTZ, decreased dose and added Coreg 6.25 mg BID considering his risk for CAD Counseled for compliance with the medications Advised DASH diet and moderate exercise/walking, at least 150 mins/wee Needs to quit smoking and be compliant to medications

## 2021-08-05 ENCOUNTER — Other Ambulatory Visit: Payer: Self-pay

## 2021-08-05 ENCOUNTER — Ambulatory Visit (INDEPENDENT_AMBULATORY_CARE_PROVIDER_SITE_OTHER): Payer: Medicare HMO

## 2021-08-05 DIAGNOSIS — Z0000 Encounter for general adult medical examination without abnormal findings: Secondary | ICD-10-CM

## 2021-08-05 NOTE — Progress Notes (Signed)
Subjective:   Brett Elliott is a 75 y.o. male who presents for an Initial Medicare Annual Wellness Visit. I connected with  Clare Charon on 08/05/21 by a audio enabled telemedicine application and verified that I am speaking with the correct person using two identifiers.   I discussed the limitations of evaluation and management by telemedicine. The patient expressed understanding and agreed to proceed.   Location of patient:Home  Location of Provider:Office  Persons participating in virtual visit: Brett Elliott (patient) and Merari Pion [place] Review of Systems    Defer to PCP       Objective:    Today's Vitals   08/05/21 1516  PainSc: 0-No pain   There is no height or weight on file to calculate BMI.  Advanced Directives 08/05/2021 05/22/2017 05/22/2017 04/24/2013 02/08/2012  Does Patient Have a Medical Advance Directive? No No No Patient does not have advance directive Patient does not have advance directive  Would patient like information on creating a medical advance directive? - No - Patient declined - - -  Pre-existing out of facility DNR order (yellow form or pink MOST form) - - - No No    Current Medications (verified) Outpatient Encounter Medications as of 08/05/2021  Medication Sig   aspirin EC 325 MG tablet Take 325 mg by mouth daily.   atorvastatin (LIPITOR) 10 MG tablet Take 10 mg by mouth at bedtime.   carvedilol (COREG) 6.25 MG tablet Take 1 tablet (6.25 mg total) by mouth 2 (two) times daily with a meal.   celecoxib (CELEBREX) 100 MG capsule Take 100 mg by mouth 2 (two) times daily as needed.   imiquimod (ALDARA) 5 % cream Apply topically 3 (three) times a week.   Olmesartan-amLODIPine-HCTZ 20-5-12.5 MG TABS Take 1 tablet by mouth daily.   escitalopram (LEXAPRO) 10 MG tablet Take 1 tablet (10 mg total) by mouth daily. (Patient not taking: No sig reported)   nicotine (NICODERM CQ - DOSED IN MG/24 HOURS) 14 mg/24hr patch Place 1 patch (14 mg total) onto the skin  daily. (Patient not taking: No sig reported)   No facility-administered encounter medications on file as of 08/05/2021.    Allergies (verified) Cephalexin   History: Past Medical History:  Diagnosis Date   Anxiety    Brain aneurysm    Hypertension    Past Surgical History:  Procedure Laterality Date   BRAIN SURGERY     CATARACT EXTRACTION W/ INTRAOCULAR LENS IMPLANT Bilateral    IR RADIOLOGIST EVAL & MGMT  07/03/2017   History reviewed. No pertinent family history. Social History   Socioeconomic History   Marital status: Married    Spouse name: Not on file   Number of children: Not on file   Years of education: Not on file   Highest education level: Not on file  Occupational History   Not on file  Tobacco Use   Smoking status: Every Day    Packs/day: 1.50    Types: Cigarettes   Smokeless tobacco: Never  Substance and Sexual Activity   Alcohol use: No   Drug use: No   Sexual activity: Not on file  Other Topics Concern   Not on file  Social History Narrative   Not on file   Social Determinants of Health   Financial Resource Strain: Medium Risk   Difficulty of Paying Living Expenses: Somewhat hard  Food Insecurity: No Food Insecurity   Worried About Running Out of Food in the Last Year: Never true   Ran  Out of Food in the Last Year: Never true  Transportation Needs: No Transportation Needs   Lack of Transportation (Medical): No   Lack of Transportation (Non-Medical): No  Physical Activity: Inactive   Days of Exercise per Week: 0 days   Minutes of Exercise per Session: 0 min  Stress: No Stress Concern Present   Feeling of Stress : Only a little  Social Connections: Moderately Isolated   Frequency of Communication with Friends and Family: More than three times a week   Frequency of Social Gatherings with Friends and Family: More than three times a week   Attends Religious Services: Never   Database administratorActive Member of Clubs or Organizations: No   Attends Museum/gallery exhibitions officerClub or  Organization Meetings: Never   Marital Status: Married    Tobacco Counseling Ready to quit: Not Answered Counseling given: Not Answered   Clinical Intake:  Pre-visit preparation completed: Yes  Pain : No/denies pain Pain Score: 0-No pain     Nutritional Risks: None Diabetes: No  How often do you need to have someone help you when you read instructions, pamphlets, or other written materials from your doctor or pharmacy?: 1 - Never What is the last grade level you completed in school?: 12TH GRADE  Diabetic?No  Interpreter Needed?: No  Information entered by :: Orissa Arreaga J,CMA   Activities of Daily Living In your present state of health, do you have any difficulty performing the following activities: 08/05/2021 03/10/2021  Hearing? N N  Vision? N N  Difficulty concentrating or making decisions? Y N  Walking or climbing stairs? Y N  Dressing or bathing? N N  Doing errands, shopping? Y N  Preparing Food and eating ? N -  Using the Toilet? N -  In the past six months, have you accidently leaked urine? N -  Do you have problems with loss of bowel control? N -  Managing your Medications? N -  Managing your Finances? N -  Housekeeping or managing your Housekeeping? N -  Some recent data might be hidden    Patient Care Team: Anabel HalonPatel, Rutwik K, MD as PCP - General (Internal Medicine) Lanelle Balarver, Charles K, DO as Consulting Physician (Gastroenterology)  Indicate any recent Medical Services you may have received from other than [facility] providers in the past year (date may be approximate).     Assessment:   This is a routine wellness examination for Brett Elliott.  Hearing/Vision screen No results found.  Dietary issues and exercise activities discussed: Current Exercise Habits: The patient does not participate in regular exercise at present   Goals Addressed   None    Depression Screen PHQ 2/9 Scores 08/05/2021 07/28/2021 06/28/2021 03/28/2021 03/10/2021  PHQ - 2 Score 0 0 0 0 0    Fall  Risk Fall Risk  08/05/2021 07/28/2021 06/28/2021 03/28/2021 03/10/2021  Falls in the past year? 0 0 0 0 0  Number falls in past yr: 0 0 0 0 0  Injury with Fall? 0 0 0 0 0  Risk for fall due to : No Fall Risks No Fall Risks No Fall Risks No Fall Risks No Fall Risks  Follow up Falls evaluation completed Falls evaluation completed Falls evaluation completed Falls evaluation completed Falls evaluation completed    FALL RISK PREVENTION PERTAINING TO THE HOME:  Any stairs in or around the home? Yes  If so, are there any without handrails? No  Home free of loose throw rugs in walkways, pet beds, electrical cords, etc? Yes  Adequate lighting in your  home to reduce risk of falls? Yes   ASSISTIVE DEVICES UTILIZED TO PREVENT FALLS:  Life alert? No  Use of a cane, walker or w/c? No  Grab bars in the bathroom? No  Shower chair or bench in shower? Yes  Elevated toilet seat or a handicapped toilet? Yes   TIMED UP AND GO:  Was the test performed?  N/A .  Length of time to ambulate 10 feet: N/A sec.     Cognitive Function:        Immunizations Immunization History  Administered Date(s) Administered   Fluad Quad(high Dose 65+) 07/28/2021   Moderna Sars-Covid-2 Vaccination 01/21/2020, 02/16/2020    TDAP status: Due, Education has been provided regarding the importance of this vaccine. Advised may receive this vaccine at local pharmacy or Health Dept. Aware to provide a copy of the vaccination record if obtained from local pharmacy or Health Dept. Verbalized acceptance and understanding.  Flu Vaccine status: Up to date  Pneumococcal vaccine status: Due, Education has been provided regarding the importance of this vaccine. Advised may receive this vaccine at local pharmacy or Health Dept. Aware to provide a copy of the vaccination record if obtained from local pharmacy or Health Dept. Verbalized acceptance and understanding.  Covid-19 vaccine status: Information provided on how to obtain vaccines.    Qualifies for Shingles Vaccine? Yes   Zostavax completed No   Shingrix Completed?: No.    Education has been provided regarding the importance of this vaccine. Patient has been advised to call insurance company to determine out of pocket expense if they have not yet received this vaccine. Advised may also receive vaccine at local pharmacy or Health Dept. Verbalized acceptance and understanding.  Screening Tests Health Maintenance  Topic Date Due   Zoster Vaccines- Shingrix (1 of 2) Never done   COLONOSCOPY (Pts 45-73yrs Insurance coverage will need to be confirmed)  Never done   PNA vac Low Risk Adult (1 of 2 - PCV13) Never done   COVID-19 Vaccine (3 - Moderna risk series) 03/15/2020   TETANUS/TDAP  03/10/2022 (Originally 04/05/1965)   INFLUENZA VACCINE  Completed   Hepatitis C Screening  Completed   HPV VACCINES  Aged Out    Health Maintenance  Health Maintenance Due  Topic Date Due   Zoster Vaccines- Shingrix (1 of 2) Never done   COLONOSCOPY (Pts 45-85yrs Insurance coverage will need to be confirmed)  Never done   PNA vac Low Risk Adult (1 of 2 - PCV13) Never done   COVID-19 Vaccine (3 - Moderna risk series) 03/15/2020    Colorectal cancer screening: Referral to GI placed 03/10/2021. Pt aware the office will call re: appt. Patient is scheduled for November 2022.  Lung Cancer Screening: (Low Dose CT Chest recommended if Age 27-80 years, 30 pack-year currently smoking OR have quit w/in 15years.) does qualify.   Lung Cancer Screening Referral: no  Additional Screening:  Hepatitis C Screening: does qualify; Completed 03/28/2021  Vision Screening: Recommended annual ophthalmology exams for early detection of glaucoma and other disorders of the eye. Is the patient up to date with their annual eye exam?  Yes  Who is the provider or what is the name of the office in which the patient attends annual eye exams? [facility]  If pt is not established with a provider, would they like  to be referred to a provider to establish care? No .   Dental Screening: Recommended annual dental exams for proper oral hygiene  Community Resource Referral /  Chronic Care Management: CRR required this visit?  No   CCM required this visit?  No      Plan:     I have personally reviewed and noted the following in the patient's chart:   Medical and social history Use of alcohol, tobacco or illicit drugs  Current medications and supplements including opioid prescriptions. Patient is not currently taking opioid prescriptions. Functional ability and status Nutritional status Physical activity Advanced directives List of other physicians Hospitalizations, surgeries, and ER visits in previous 12 months Vitals Screenings to include cognitive, depression, and falls Referrals and appointments  In addition, I have reviewed and discussed with patient certain preventive protocols, quality metrics, and best practice recommendations. A written personalized care plan for preventive services as well as general preventive health recommendations were provided to patient.     Brett Elliott, CMA   08/05/2021   Nurse Notes: Non Face to Face 30 minute visit   Brett Elliott , Thank you for taking time to come for your Medicare Wellness Visit. I appreciate your ongoing commitment to your health goals. Please review the following plan we discussed and let me know if I can assist you in the future.   These are the goals we discussed:  Goals   None     This is a list of the screening recommended for you and due dates:  Health Maintenance  Topic Date Due   Zoster (Shingles) Vaccine (1 of 2) Never done   Colon Cancer Screening  Never done   Pneumonia vaccines (1 of 2 - PCV13) Never done   COVID-19 Vaccine (3 - Moderna risk series) 03/15/2020   Tetanus Vaccine  03/10/2022*   Flu Shot  Completed   Hepatitis C Screening: USPSTF Recommendation to screen - Ages 18-79 yo.  Completed   HPV Vaccine   Aged Out  *Topic was postponed. The date shown is not the original due date.   Patient stated he is having frequent urination that's been ongoing over a year. Forgot to mention during OV on 07/28/2021. Patient also c/o Neuropathy in left and right foot. Patient has been advised to call and schedule a follow up with PCP to address these issues.

## 2021-08-05 NOTE — Patient Instructions (Signed)
Health Maintenance, Male Adopting a healthy lifestyle and getting preventive care are important in promoting health and wellness. Ask your health care provider about: The right schedule for you to have regular tests and exams. Things you can do on your own to prevent diseases and keep yourself healthy. What should I know about diet, weight, and exercise? Eat a healthy diet  Eat a diet that includes plenty of vegetables, fruits, low-fat dairy products, and lean protein. Do not eat a lot of foods that are high in solid fats, added sugars, or sodium. Maintain a healthy weight Body mass index (BMI) is a measurement that can be used to identify possible weight problems. It estimates body fat based on height and weight. Your health care provider can help determine your BMI and help you achieve or maintain a healthy weight. Get regular exercise Get regular exercise. This is one of the most important things you can do for your health. Most adults should: Exercise for at least 150 minutes each week. The exercise should increase your heart rate and make you sweat (moderate-intensity exercise). Do strengthening exercises at least twice a week. This is in addition to the moderate-intensity exercise. Spend less time sitting. Even light physical activity can be beneficial. Watch cholesterol and blood lipids Have your blood tested for lipids and cholesterol at 75 years of age, then have this test every 5 years. You may need to have your cholesterol levels checked more often if: Your lipid or cholesterol levels are high. You are older than 75 years of age. You are at high risk for heart disease. What should I know about cancer screening? Many types of cancers can be detected early and may often be prevented. Depending on your health history and family history, you may need to have cancer screening at various ages. This may include screening for: Colorectal cancer. Prostate cancer. Skin cancer. Lung  cancer. What should I know about heart disease, diabetes, and high blood pressure? Blood pressure and heart disease High blood pressure causes heart disease and increases the risk of stroke. This is more likely to develop in people who have high blood pressure readings, are of African descent, or are overweight. Talk with your health care provider about your target blood pressure readings. Have your blood pressure checked: Every 3-5 years if you are 18-39 years of age. Every year if you are 40 years old or older. If you are between the ages of 65 and 75 and are a current or former smoker, ask your health care provider if you should have a one-time screening for abdominal aortic aneurysm (AAA). Diabetes Have regular diabetes screenings. This checks your fasting blood sugar level. Have the screening done: Once every three years after age 45 if you are at a normal weight and have a low risk for diabetes. More often and at a younger age if you are overweight or have a high risk for diabetes. What should I know about preventing infection? Hepatitis B If you have a higher risk for hepatitis B, you should be screened for this virus. Talk with your health care provider to find out if you are at risk for hepatitis B infection. Hepatitis C Blood testing is recommended for: Everyone born from 1945 through 1965. Anyone with known risk factors for hepatitis C. Sexually transmitted infections (STIs) You should be screened each year for STIs, including gonorrhea and chlamydia, if: You are sexually active and are younger than 75 years of age. You are older than 75 years   of age and your health care provider tells you that you are at risk for this type of infection. Your sexual activity has changed since you were last screened, and you are at increased risk for chlamydia or gonorrhea. Ask your health care provider if you are at risk. Ask your health care provider about whether you are at high risk for HIV.  Your health care provider may recommend a prescription medicine to help prevent HIV infection. If you choose to take medicine to prevent HIV, you should first get tested for HIV. You should then be tested every 3 months for as long as you are taking the medicine. Follow these instructions at home: Lifestyle Do not use any products that contain nicotine or tobacco, such as cigarettes, e-cigarettes, and chewing tobacco. If you need help quitting, ask your health care provider. Do not use street drugs. Do not share needles. Ask your health care provider for help if you need support or information about quitting drugs. Alcohol use Do not drink alcohol if your health care provider tells you not to drink. If you drink alcohol: Limit how much you have to 0-2 drinks a day. Be aware of how much alcohol is in your drink. In the [place] one drink equals one 12 oz bottle of beer (355 mL), one 5 oz glass of wine (148 mL), or one 1 oz glass of hard liquor (44 mL). General instructions Schedule regular health, dental, and eye exams. Stay current with your vaccines. Tell your health care provider if: You often feel depressed. You have ever been abused or do not feel safe at home. Summary Adopting a healthy lifestyle and getting preventive care are important in promoting health and wellness. Follow your health care provider's instructions about healthy diet, exercising, and getting tested or screened for diseases. Follow your health care provider's instructions on monitoring your cholesterol and blood pressure. This information is not intended to replace advice given to you by your health care provider. Make sure you discuss any questions you have with your health care provider. Document Revised: 01/21/2021 Document Reviewed: 11/06/2018 Elsevier Patient Education  2022 Elsevier Inc.  

## 2021-08-12 ENCOUNTER — Encounter (HOSPITAL_COMMUNITY): Payer: Self-pay

## 2021-08-12 ENCOUNTER — Ambulatory Visit (HOSPITAL_COMMUNITY)
Admission: RE | Admit: 2021-08-12 | Discharge: 2021-08-12 | Disposition: A | Discharge status: Home / Self Care | Payer: Medicare HMO | Source: Ambulatory Visit | Attending: Internal Medicine | Admitting: Internal Medicine

## 2021-08-12 ENCOUNTER — Other Ambulatory Visit: Payer: Self-pay

## 2021-08-12 DIAGNOSIS — G8929 Other chronic pain: Secondary | ICD-10-CM

## 2021-10-10 ENCOUNTER — Encounter: Payer: Self-pay | Admitting: Internal Medicine

## 2021-10-10 ENCOUNTER — Ambulatory Visit: Payer: Medicare HMO | Admitting: Gastroenterology

## 2021-10-31 ENCOUNTER — Ambulatory Visit (INDEPENDENT_AMBULATORY_CARE_PROVIDER_SITE_OTHER): Payer: Medicare HMO | Admitting: Internal Medicine

## 2021-10-31 ENCOUNTER — Encounter: Payer: Self-pay | Admitting: Internal Medicine

## 2021-10-31 ENCOUNTER — Other Ambulatory Visit: Payer: Self-pay

## 2021-10-31 VITALS — BP 172/88 | HR 88 | Resp 18 | Ht 68.0 in | Wt 215.1 lb

## 2021-10-31 DIAGNOSIS — I10 Essential (primary) hypertension: Secondary | ICD-10-CM

## 2021-10-31 DIAGNOSIS — F419 Anxiety disorder, unspecified: Secondary | ICD-10-CM

## 2021-10-31 DIAGNOSIS — Z23 Encounter for immunization: Secondary | ICD-10-CM

## 2021-10-31 DIAGNOSIS — Z125 Encounter for screening for malignant neoplasm of prostate: Secondary | ICD-10-CM

## 2021-10-31 DIAGNOSIS — E782 Mixed hyperlipidemia: Secondary | ICD-10-CM | POA: Diagnosis not present

## 2021-10-31 DIAGNOSIS — Z720 Tobacco use: Secondary | ICD-10-CM | POA: Diagnosis not present

## 2021-10-31 DIAGNOSIS — E559 Vitamin D deficiency, unspecified: Secondary | ICD-10-CM | POA: Diagnosis not present

## 2021-10-31 DIAGNOSIS — R7303 Prediabetes: Secondary | ICD-10-CM

## 2021-10-31 MED ORDER — CARVEDILOL 6.25 MG PO TABS: 6.2500 mg | ORAL_TABLET | Freq: Two times a day (BID) | ORAL | 3 refills | Status: DC

## 2021-10-31 MED ORDER — ESCITALOPRAM OXALATE 10 MG PO TABS: 10.0000 mg | ORAL_TABLET | Freq: Every day | ORAL | 2 refills | Status: DC

## 2021-10-31 MED ORDER — OLMESARTAN-AMLODIPINE-HCTZ 20-5-12.5 MG PO TABS: 1.0000 | ORAL_TABLET | Freq: Every day | ORAL | 2 refills | Status: DC

## 2021-10-31 NOTE — Progress Notes (Signed)
Established Patient Office Visit  Subjective:  Patient ID: Brett Elliott, male    DOB: 1946/03/08  Age: 75 y.o. MRN: [id number]  CC:  Chief Complaint  Patient presents with   Follow-up    3 month follow up HTN     HPI Brett Elliott is a 74 y.o. male with past medical history of hypertension, cerebral aneurysm s/p endovascular coiling and hyperlipidemia, anxiety, tobacco abuse, osteoarthritis and chronic genital wart who presents for follow up of his chronic medical conditions. His wife is present during the visit.  HTN: BP is uncontrolled. He has not been taking his antihypertensive medications as prescribed.  He has not brought his home meds today, and is not sure which ones he is taking.  He has intermittent generalized headache.  He denies any dizziness, chest pain or palpitations currently.  He continues to have anxiety and has not been taking Lexapro yet.  Denies any anhedonia, SI or HI.  He has not had MRI of the brain done as he has claustrophobia.  He does not want to get MRI now.  He is advised to contact us immediately if he has any change in mental status, worsening of headache, or any new neurologic deficits.  He expressed understanding.  He received PCV 20 in the office today.  Past Medical History:  Diagnosis Date   Anxiety    Brain aneurysm    Hypertension     Past Surgical History:  Procedure Laterality Date   BRAIN SURGERY     CATARACT EXTRACTION W/ INTRAOCULAR LENS IMPLANT Bilateral    IR RADIOLOGIST EVAL & MGMT  07/03/2017    History reviewed. No pertinent family history.  Social History   Socioeconomic History   Marital status: Married    Spouse name: Not on file   Number of children: Not on file   Years of education: Not on file   Highest education level: Not on file  Occupational History   Not on file  Tobacco Use   Smoking status: Every Day    Packs/day: 1.50    Types: Cigarettes   Smokeless tobacco: Never  Substance and Sexual Activity    Alcohol use: No   Drug use: No   Sexual activity: Not on file  Other Topics Concern   Not on file  Social History Narrative   Not on file   Social Determinants of Health   Financial Resource Strain: Medium Risk   Difficulty of Paying Living Expenses: Somewhat hard  Food Insecurity: No Food Insecurity   Worried About Running Out of Food in the Last Year: Never true   Ran Out of Food in the Last Year: Never true  Transportation Needs: No Transportation Needs   Lack of Transportation (Medical): No   Lack of Transportation (Non-Medical): No  Physical Activity: Inactive   Days of Exercise per Week: 0 days   Minutes of Exercise per Session: 0 min  Stress: No Stress Concern Present   Feeling of Stress : Only a little  Social Connections: Moderately Isolated   Frequency of Communication with Friends and Family: More than three times a week   Frequency of Social Gatherings with Friends and Family: More than three times a week   Attends Religious Services: Never   Marine scientist or Organizations: No   Attends Archivist Meetings: Never   Marital Status: Married  Human resources officer Violence: Not At Risk   Fear of Current or Ex-Partner: No   Emotionally Abused:  No   Physically Abused: No   Sexually Abused: No    Outpatient Medications Prior to Visit  Medication Sig Dispense Refill   aspirin EC 325 MG tablet Take 325 mg by mouth daily.     atorvastatin (LIPITOR) 10 MG tablet Take 10 mg by mouth at bedtime.     celecoxib (CELEBREX) 100 MG capsule Take 100 mg by mouth 2 (two) times daily as needed.     imiquimod (ALDARA) 5 % cream Apply topically 3 (three) times a week. 12 each 0   carvedilol (COREG) 6.25 MG tablet Take 1 tablet (6.25 mg total) by mouth 2 (two) times daily with a meal. 60 tablet 3   escitalopram (LEXAPRO) 10 MG tablet Take 1 tablet (10 mg total) by mouth daily. 30 tablet 2   Olmesartan-amLODIPine-HCTZ 20-5-12.5 MG TABS Take 1 tablet by mouth daily. 30  tablet 2   nicotine (NICODERM CQ - DOSED IN MG/24 HOURS) 14 mg/24hr patch Place 1 patch (14 mg total) onto the skin daily. (Patient not taking: Reported on 10/31/2021) 28 patch 2   No facility-administered medications prior to visit.    Allergies  Allergen Reactions   Cephalexin Hives and Rash    ROS Review of Systems  Constitutional:  Negative for chills and fever.  HENT:  Negative for congestion and sore throat.   Eyes:  Negative for pain and discharge.  Respiratory:  Negative for cough and shortness of breath.   Cardiovascular:  Negative for chest pain and palpitations.  Gastrointestinal:  Negative for constipation, diarrhea, nausea and vomiting.  Endocrine: Negative for polydipsia and polyuria.  Genitourinary:  Negative for dysuria and hematuria.  Musculoskeletal:  Negative for neck pain and neck stiffness.  Skin:  Negative for rash.       Wart  Neurological:  Positive for headaches. Negative for dizziness, weakness and numbness.  Psychiatric/Behavioral:  Positive for sleep disturbance. Negative for agitation and behavioral problems. The patient is nervous/anxious.      Objective:    Physical Exam Vitals reviewed.  Constitutional:      General: He is not in acute distress.    Appearance: He is not diaphoretic.  HENT:     Head: Normocephalic and atraumatic.     Nose: Nose normal.     Mouth/Throat:     Mouth: Mucous membranes are moist.  Eyes:     General: No scleral icterus.    Extraocular Movements: Extraocular movements intact.  Cardiovascular:     Rate and Rhythm: Normal rate and regular rhythm.     Pulses: Normal pulses.     Heart sounds: Normal heart sounds. No murmur heard. Pulmonary:     Breath sounds: Normal breath sounds. No wheezing or rales.  Musculoskeletal:     Cervical back: Neck supple. No tenderness.     Right lower leg: No edema.     Left lower leg: No edema.  Skin:    General: Skin is warm.     Findings: No rash.  Neurological:     General:  No focal deficit present.     Mental Status: He is alert and oriented to person, place, and time.     Sensory: No sensory deficit.     Motor: No weakness.  Psychiatric:        Mood and Affect: Mood normal.        Behavior: Behavior normal.    BP (!) 172/88 (BP Location: Left Arm, Patient Position: Sitting, Cuff Size: Normal)   Pulse 88  Resp 18   Ht '5\' 8"'  (1.727 m)   Wt 215 lb 1.3 oz (97.6 kg)   SpO2 96%   BMI 32.70 kg/m  Wt Readings from Last 3 Encounters:  10/31/21 215 lb 1.3 oz (97.6 kg)  07/28/21 214 lb (97.1 kg)  06/28/21 210 lb 1.9 oz (95.3 kg)    Lab Results  Component Value Date   TSH 1.050 03/28/2021   Lab Results  Component Value Date   WBC 6.6 03/28/2021   HGB 15.7 03/28/2021   HCT 47.7 03/28/2021   MCV 92 03/28/2021   PLT 205 03/28/2021   Lab Results  Component Value Date   NA 140 03/28/2021   K 4.2 03/28/2021   CO2 23 03/28/2021   GLUCOSE 103 (H) 03/28/2021   BUN 11 03/28/2021   CREATININE 0.94 03/28/2021   BILITOT 0.3 03/28/2021   ALKPHOS 95 03/28/2021   AST 23 03/28/2021   ALT 12 03/28/2021   PROT 6.7 03/28/2021   ALBUMIN 4.2 03/28/2021   CALCIUM 9.2 03/28/2021   ANIONGAP 10 05/22/2017   EGFR 85 03/28/2021   Lab Results  Component Value Date   CHOL 147 03/28/2021   Lab Results  Component Value Date   HDL 32 (L) 03/28/2021   Lab Results  Component Value Date   LDLCALC 87 03/28/2021   Lab Results  Component Value Date   TRIG 162 (H) 03/28/2021   Lab Results  Component Value Date   CHOLHDL 4.6 03/28/2021   Lab Results  Component Value Date   HGBA1C 5.8 (H) 03/28/2021      Assessment & Plan:   Problem List Items Addressed This Visit       Cardiovascular and Mediastinum   Hypertension - Primary    BP Readings from Last 1 Encounters:  10/31/21 (!) 172/88  uncontrolled due to noncompliance with Olmesartan-Amlodipine-HCTZ and Coreg 6.25 mg BID - had added Coreg considering his risk for CAD Counseled for compliance with  the medications Advised DASH diet and moderate exercise/walking, at least 150 mins/wee Needs to quit smoking and be compliant to medications  Refer to CCM for medication adherence counseling      Relevant Medications   Olmesartan-amLODIPine-HCTZ 20-5-12.5 MG TABS   carvedilol (COREG) 6.25 MG tablet   Other Relevant Orders   AMB Referral to [facility]   CBC with Differential/Platelet     Other   Hyperlipidemia    Continue Atorvastatin Lipid profile reviewed      Relevant Medications   Olmesartan-amLODIPine-HCTZ 20-5-12.5 MG TABS   carvedilol (COREG) 6.25 MG tablet   Other Relevant Orders   AMB Referral to [place]   Tobacco abuse    Smokes about 1 pack/day  Asked about quitting: confirms that he currently smokes cigarettes Advise to quit smoking: Educated about QUITTING to reduce the risk of cancer, cardio and cerebrovascular disease. Assess willingness: Unwilling to quit at this time, but is working on cutting back. Assist with counseling and pharmacotherapy: Counseled for 5 minutes and literature provided. Nicotine patch prescribed. Arrange for follow up: follow up in 3 months and continue to offer help.      Anxiety    Uncontrolled, needs to take Lexapro regularly      Relevant Medications   escitalopram (LEXAPRO) 10 MG tablet   Other Relevant Orders   AMB Referral to [facility]   Prostate cancer screening    Ordered PSA after discussing its limitations for prostate cancer screening, including false positive results leading additional  investigations.       Relevant Orders   PSA   Other Visit Diagnoses     Prediabetes       Relevant Orders   Hemoglobin A1c   CMP14+EGFR   Vitamin D deficiency       Relevant Orders   VITAMIN D 25 Hydroxy (Vit-D Deficiency, Fractures)   Need for viral immunization       Relevant Orders   Pneumococcal conjugate vaccine 20-valent (Prevnar 20) (Completed)       Meds  ordered this encounter  Medications   Olmesartan-amLODIPine-HCTZ 20-5-12.5 MG TABS    Sig: Take 1 tablet by mouth daily.    Dispense:  30 tablet    Refill:  2   carvedilol (COREG) 6.25 MG tablet    Sig: Take 1 tablet (6.25 mg total) by mouth 2 (two) times daily with a meal.    Dispense:  60 tablet    Refill:  3   escitalopram (LEXAPRO) 10 MG tablet    Sig: Take 1 tablet (10 mg total) by mouth daily.    Dispense:  30 tablet    Refill:  2    Follow-up: Return in about 3 months (around 01/29/2022) for HTN.    Lindell Spar, MD

## 2021-10-31 NOTE — Patient Instructions (Addendum)
Please start taking medications as prescribed.  Please continue to follow low salt diet and try to cut down to quit smoking.

## 2021-10-31 NOTE — Assessment & Plan Note (Signed)
Continue Atorvastatin Lipid profile reviewed

## 2021-10-31 NOTE — Assessment & Plan Note (Signed)
BP Readings from Last 1 Encounters:  10/31/21 (!) 172/88   uncontrolled due to noncompliance with Olmesartan-Amlodipine-HCTZ and Coreg 6.25 mg BID - had added Coreg considering his risk for CAD Counseled for compliance with the medications Advised DASH diet and moderate exercise/walking, at least 150 mins/wee Needs to quit smoking and be compliant to medications  Refer to CCM for medication adherence counseling

## 2021-10-31 NOTE — Assessment & Plan Note (Addendum)
Ordered PSA after discussing its limitations for prostate cancer screening, including false positive results leading additional investigations. 

## 2021-10-31 NOTE — Assessment & Plan Note (Signed)
Uncontrolled, needs to take Lexapro regularly

## 2021-10-31 NOTE — Assessment & Plan Note (Signed)
Smokes about 1 pack/day  Asked about quitting: confirms that he currently smokes cigarettes Advise to quit smoking: Educated about QUITTING to reduce the risk of cancer, cardio and cerebrovascular disease. Assess willingness: Unwilling to quit at this time, but is working on cutting back. Assist with counseling and pharmacotherapy: Counseled for 5 minutes and literature provided. Nicotine patch prescribed. Arrange for follow up: follow up in 3 months and continue to offer help.

## 2021-11-01 ENCOUNTER — Telehealth: Payer: Self-pay | Admitting: *Deleted

## 2021-11-01 NOTE — Chronic Care Management (AMB) (Signed)
  Chronic Care Management   Note  11/01/2021 Name: Brett Elliott MRN: [id number] DOB: 1946/11/11  Brett Elliott is a 75 y.o. year old male who is a primary care patient of Lindell Spar, MD. I reached out to Garald Balding by phone today in response to a referral sent by Brett Elliott PCP.  [name] was given information about Chronic Care Management services today including:  CCM service includes personalized support from designated clinical staff supervised by his physician, including individualized plan of care and coordination with other care providers 24/7 contact phone numbers for assistance for urgent and routine care needs. Service will only be billed when office clinical staff spend 20 minutes or more in a month to coordinate care. Only one practitioner may furnish and bill the service in a calendar month. The patient may stop CCM services at any time (effective at the end of the month) by phone call to the office staff. The patient is responsible for co-pay (up to 20% after annual deductible is met) if co-pay is required by the individual health plan.   Patient agreed to services and verbal consent obtained.   Follow up plan: Telephone appointment with care management team member scheduled for:11/02/21  [facility]  Direct Dial: [phone]

## 2021-11-01 NOTE — Chronic Care Management (AMB) (Signed)
  Chronic Care Management   Outreach Note  11/01/2021 Name: Brett Elliott MRN: [id number] DOB: [birth year]  Brett Elliott is a 75 y.o. year old male who is a primary care patient of Anabel Halon, MD. I reached out to Brett Elliott by phone today in response to a referral sent by Brett Elliott primary care provider.  An unsuccessful telephone outreach was attempted today. The patient was referred to the case management team for assistance with care management and care coordination.   Follow Up Plan: A HIPAA compliant phone message was left for the patient providing contact information and requesting a return call. The care management team will reach out to the patient again over the next 7 days. If patient returns call to provider office, please advise to call Embedded Care Management Care Guide Brett Elliott at [phone].  Brett Elliott  Care Guide, Embedded Care Coordination [facility] Management  Direct Dial: [phone]

## 2021-11-02 ENCOUNTER — Ambulatory Visit: Payer: Medicare HMO | Admitting: Pharmacist

## 2021-11-02 DIAGNOSIS — F419 Anxiety disorder, unspecified: Secondary | ICD-10-CM

## 2021-11-02 DIAGNOSIS — E782 Mixed hyperlipidemia: Secondary | ICD-10-CM

## 2021-11-02 DIAGNOSIS — I10 Essential (primary) hypertension: Secondary | ICD-10-CM

## 2021-11-02 DIAGNOSIS — Z720 Tobacco use: Secondary | ICD-10-CM

## 2021-11-02 LAB — CBC WITH DIFFERENTIAL/PLATELET
Basophils Absolute: 0.1 10*3/uL (ref 0.0–0.2)
Basos: 1 %
EOS (ABSOLUTE): 0.1 10*3/uL (ref 0.0–0.4)
Eos: 1 %
Hematocrit: 46 % (ref 37.5–51.0)
Hemoglobin: 15.6 g/dL (ref 13.0–17.7)
Immature Grans (Abs): 0.1 10*3/uL (ref 0.0–0.1)
Immature Granulocytes: 1 %
Lymphocytes Absolute: 2.3 10*3/uL (ref 0.7–3.1)
Lymphs: 21 %
MCH: 31 pg (ref 26.6–33.0)
MCHC: 33.9 g/dL (ref 31.5–35.7)
MCV: 92 fL (ref 79–97)
Monocytes Absolute: 1 10*3/uL — ABNORMAL HIGH (ref 0.1–0.9)
Monocytes: 9 %
Neutrophils Absolute: 7.2 10*3/uL — ABNORMAL HIGH (ref 1.4–7.0)
Neutrophils: 67 %
Platelets: 264 10*3/uL (ref 150–450)
RBC: 5.03 x10E6/uL (ref 4.14–5.80)
RDW: 14.4 % (ref 11.6–15.4)
WBC: 10.8 10*3/uL (ref 3.4–10.8)

## 2021-11-02 LAB — CMP14+EGFR
ALT: 8 IU/L (ref 0–44)
AST: 19 IU/L (ref 0–40)
Albumin/Globulin Ratio: 1.6 (ref 1.2–2.2)
Albumin: 4.2 g/dL (ref 3.7–4.7)
Alkaline Phosphatase: 89 IU/L (ref 44–121)
BUN/Creatinine Ratio: 8 — ABNORMAL LOW (ref 10–24)
BUN: 8 mg/dL (ref 8–27)
Bilirubin Total: 0.4 mg/dL (ref 0.0–1.2)
CO2: 29 mmol/L (ref 20–29)
Calcium: 9.4 mg/dL (ref 8.6–10.2)
Chloride: 98 mmol/L (ref 96–106)
Creatinine, Ser: 0.96 mg/dL (ref 0.76–1.27)
Globulin, Total: 2.7 g/dL (ref 1.5–4.5)
Glucose: 95 mg/dL (ref 70–99)
Potassium: 4.5 mmol/L (ref 3.5–5.2)
Sodium: 139 mmol/L (ref 134–144)
Total Protein: 6.9 g/dL (ref 6.0–8.5)
eGFR: 82 mL/min/{1.73_m2} (ref >59)

## 2021-11-02 LAB — HEMOGLOBIN A1C
Est. average glucose Bld gHb Est-mCnc: 117 mg/dL
Hgb A1c MFr Bld: 5.7 % — ABNORMAL HIGH (ref 4.8–5.6)

## 2021-11-02 LAB — PSA: Prostate Specific Ag, Serum: 1.1 ng/mL (ref 0.0–4.0)

## 2021-11-02 LAB — VITAMIN D 25 HYDROXY (VIT D DEFICIENCY, FRACTURES): Vit D, 25-Hydroxy: 80.4 ng/mL (ref 30.0–100.0)

## 2021-11-02 MED ORDER — TELMISARTAN 40 MG PO TABS: 40.0000 mg | ORAL_TABLET | Freq: Every day | ORAL | 2 refills | Status: DC

## 2021-11-02 NOTE — Chronic Care Management (AMB) (Signed)
Chronic Care Management Pharmacy Note  11/02/2021 Name:  Brett Elliott MRN:  [id number] DOB:  12/22/1945  Summary:  Hypertension Blood pressure under poor control. Blood pressure is above goal of <130/80 mmHg per 2017 AHA/ACC guidelines. Patient reports he can't function while taking olmesartan-amlodipine-hctz and is refusing to take. Patient reports he just started taking carvedilol yesterday and is working on getting in the habit of taking medications on a schedule Continue carvedilol 6.25 mg by mouth twice daily Add telmisartan 40 mg by mouth once daily Discontinue olmesartan-amlodipine-hctz since patient is refusing to take due to "intolerance" Will follow-up in 4 weeks to check BP and renal function.   Hyperlipidemia  Uncontrolled. LDL above goal of <70 due to very high risk given 10-year risk >20% per 2020 AACE/ACE guidelines. Triglycerides above goal of <150 per 2020 AACE/ACE guidelines. Current medications: atorvastatin 10 mg by mouth once daily Patient is taking aspirin 325 mg daily as needed for pain Patient reports he stopped taking since he did not know what his cholesterol level was and does not want to take medications if he doesn't know what they are for or if they are necessary Patient instructed to restart atorvastatin 10 mg by mouth daily. Patient verbalized understanding. Given extremely high ASCVD risk, may be reasonable to increase to high intensity statin. Will discuss more at future visits.  Suggested that the patient decrease aspirin to 81 mg by mouth daily for general cardiovascular benefit but to lower risk of bleeding associated with 325 mg dose  Subjective: Brett Elliott is an 75 y.o. year old male who is a primary patient of Lindell Spar, MD.  The CCM team was consulted for assistance with disease management and care coordination needs.    Engaged with patient by telephone for initial visit in response to provider referral for pharmacy case management  and/or care coordination services.   Consent to Services:  The patient was given the following information about Chronic Care Management services today, agreed to services, and gave verbal consent: 1. CCM service includes personalized support from designated clinical staff supervised by the primary care provider, including individualized plan of care and coordination with other care providers 2. 24/7 contact phone numbers for assistance for urgent and routine care needs. 3. Service will only be billed when office clinical staff spend 20 minutes or more in a month to coordinate care. 4. Only one practitioner may furnish and bill the service in a calendar month. 5.The patient may stop CCM services at any time (effective at the end of the month) by phone call to the office staff. 6. The patient will be responsible for cost sharing (co-pay) of up to 20% of the service fee (after annual deductible is met). Patient agreed to services and consent obtained.  Patient Care Team: Lindell Spar, MD as PCP - General (Internal Medicine) Eloise Harman, DO as Consulting Physician (Gastroenterology) Beryle Lathe, [facility] (Pharmacist)  Objective:  Lab Results  Component Value Date   CREATININE 0.96 10/31/2021   CREATININE 0.94 03/28/2021   CREATININE 0.80 05/21/2019    Lab Results  Component Value Date   HGBA1C 5.7 (H) 10/31/2021   Last diabetic Eye exam: No results found for: HMDIABEYEEXA  Last diabetic Foot exam: No results found for: HMDIABFOOTEX      Component Value Date/Time   CHOL 147 03/28/2021 1419   TRIG 162 (H) 03/28/2021 1419   HDL 32 (L) 03/28/2021 1419   CHOLHDL 4.6 03/28/2021 1419  [place] 87 03/28/2021 1419    Hepatic Function Latest Ref Rng & Units 10/31/2021 03/28/2021 05/22/2017  Total Protein 6.0 - 8.5 g/dL 6.9 6.7 7.0  Albumin 3.7 - 4.7 g/dL 4.2 4.2 4.0  AST 0 - 40 IU/L 19 23 32  ALT 0 - 44 IU/L _0 Alk Phosphatase 44 - 121 IU/L 89 95 84  Total Bilirubin 0.0 -  1.2 mg/dL 0.4 0.3 0.5    Lab Results  Component Value Date/Time   TSH 1.050 03/28/2021 02:19 PM   FREET4 1.12 03/28/2021 02:19 PM    CBC Latest Ref Rng & Units 10/31/2021 03/28/2021 05/22/2017  WBC 3.4 - 10.8 x10E3/uL 10.8 6.6 10.5  Hemoglobin 13.0 - 17.7 g/dL 15.6 15.7 13.4  Hematocrit 37.5 - 51.0 % 46.0 47.7 39.1  Platelets 150 - 450 x10E3/uL 264 205 275    Lab Results  Component Value Date/Time   VD25OH 80.4 10/31/2021 04:32 PM   VD25OH 47.9 03/28/2021 02:19 PM    Clinical ASCVD: No  The 10-year ASCVD risk score (Arnett DK, et al., 2019) is: 47.8%   Values used to calculate the score:     Age: 23 years     Sex: Male     Is Non-Hispanic African American: No     Diabetic: No     Tobacco smoker: Yes     Systolic Blood Pressure: 976 mmHg     Is BP treated: Yes     HDL Cholesterol: 32 mg/dL     Total Cholesterol: 147 mg/dL     Social History   Tobacco Use  Smoking Status Every Day   Packs/day: 1.00   Types: Cigarettes  Smokeless Tobacco Never   BP Readings from Last 3 Encounters:  10/31/21 (!) 172/88  07/28/21 (!) 162/82  06/28/21 (!) 168/79   Pulse Readings from Last 3 Encounters:  10/31/21 88  07/28/21 75  06/28/21 83   Wt Readings from Last 3 Encounters:  10/31/21 215 lb 1.3 oz (97.6 kg)  07/28/21 214 lb (97.1 kg)  06/28/21 210 lb 1.9 oz (95.3 kg)    Assessment: Review of patient past medical history, allergies, medications, health status, including review of consultants reports, laboratory and other test data, was performed as part of comprehensive evaluation and provision of chronic care management services.   SDOH:  (Social Determinants of Health) assessments and interventions performed:    CCM Care Plan  Allergies  Allergen Reactions   Cephalexin Hives and Rash    Medications Reviewed Today     Reviewed by Beryle Lathe, [facility] (Pharmacist) on 11/02/21 at 1321  Med List Status: <None>   Medication Order Taking? Sig Documenting  Provider Last Dose Status Informant  aspirin EC 325 MG tablet [id number] Yes Take 325 mg by mouth daily as needed for mild pain or moderate pain. [provider] Taking Active Spouse/Significant Other  atorvastatin (LIPITOR) 10 MG tablet [id number] No Take 10 mg by mouth at bedtime.  Patient not taking: Reported on 11/02/2021   [provider] Not Taking Active   carvedilol (COREG) 6.25 MG tablet [id number] Yes Take 1 tablet (6.25 mg total) by mouth 2 (two) times daily with a meal. Lindell Spar, MD Taking Active   celecoxib (CELEBREX) 100 MG capsule [id number] Yes Take 100 mg by mouth 2 (two) times daily as needed. [provider] Taking Active   escitalopram (LEXAPRO) 10 MG tablet [id number] Yes Take 1 tablet (10 mg total) by mouth daily. Lindell Spar, MD  Taking Active   imiquimod (ALDARA) 5 % cream [id number] No Apply topically 3 (three) times a week.  Patient not taking: Reported on 11/02/2021   Lindell Spar, MD Not Taking Active   Olmesartan-amLODIPine-HCTZ 20-5-12.5 MG TABS [id number] No Take 1 tablet by mouth daily.  Patient not taking: Reported on 11/02/2021   Lindell Spar, MD Not Taking Active             Patient Active Problem List   Diagnosis Date Noted   Prostate cancer screening 10/31/2021   Chronic nonintractable headache 07/29/2021   Angina pectoris ([place]) 07/29/2021   History of cerebral aneurysm repair 07/29/2021   Genital warts 03/10/2021   Anxiety 03/10/2021   OA (osteoarthritis) 03/10/2021   Hyperlipidemia 05/29/2017   Hypertension 05/29/2017   Tobacco abuse 05/29/2017   Major depressive disorder, single episode, severe without psychotic features ([place]) 05/22/2017    Immunization History  Administered Date(s) Administered   Fluad Quad(high Dose 65+) 07/28/2021   Moderna Sars-Covid-2 Vaccination 01/21/2020, 02/16/2020   PNEUMOCOCCAL CONJUGATE-20 10/31/2021    Conditions to be addressed/monitored: HTN, HLD, Anxiety, and Tobacco  Use  Care Plan : Medication Management  Updates made by Beryle Lathe, [place] since 11/02/2021 12:00 AM     Problem: HTN, HLD, Tobacco Use, Anxiety   Priority: High  Onset Date: 11/02/2021     Long-Range Goal: Disease Progression Prevention   Start Date: 11/02/2021  Expected End Date: 01/31/2022  This Visit's Progress: On track  Priority: High  Note:   Current Barriers:  Unable to independently monitor therapeutic efficacy Unable to achieve control of hypertension, hyperlipidemia, and smoking cessation  Pharmacist Clinical Goal(s):  Patient will Achieve adherence to monitoring guidelines and medication adherence to achieve therapeutic efficacy Achieve control of hypertension, hyperlipidemia, and smoking cessation as evidenced by improved blood pressure control, improved LDL, improved triglycerides, and reduced smoking through collaboration with PharmD and provider.   Interventions: 1:1 collaboration with Lindell Spar, MD regarding development and update of comprehensive plan of care as evidenced by provider attestation and co-signature Inter-disciplinary care team collaboration (see longitudinal plan of care) Comprehensive medication review performed; medication list updated in electronic medical record  Hypertension - New goal.: Blood pressure under poor control. Blood pressure is above goal of <130/80 mmHg per 2017 AHA/ACC guidelines. Current medications: carvedilol 6.25 mg by mouth twice daily and olmesartan-amlodipine-hctz 20-5-12.5 mg by mouth daily Intolerances:  patient reports he can't function while taking olmesartan-amlodipine-hctz and is refusing to take  Taking medications as directed: no, patient refusing to take olmesartan-amlodipine-hctz. Patient reports he just started taking carvedilol yesterday and is working on getting in the habit of taking medications on a schedule Side effects thought to be attributed to current medication regimen:  unclear Current  exercise: not discussed today Current home blood pressure: patient does not have a blood pressure machine at this time. He plans to see if [facility] will cover one for him. Otherwise, patient was instructed to get Omrom blood pressure machine at [facility] for ~$35 Continue carvedilol 6.25 mg by mouth twice daily Discussed with PCP and will add telmisartan 40 mg by mouth once daily Discussed with PCP and will discontinue olmesartan-amlodipine-hctz since patient is refusing to take due to "intolerance" Encourage dietary sodium restriction/DASH diet Discussed need for medication compliance Reviewed risks of hypertension, principles of treatment and consequences of untreated hypertension Patient counseled on smoking cessation Will follow-up in 4 weeks to check BP and renal function.   Hyperlipidemia - New goal.: Uncontrolled. LDL  above goal of <70 due to very high risk given 10-year risk >20% per 2020 AACE/ACE guidelines. Triglycerides above goal of <150 per 2020 AACE/ACE guidelines. Current medications: atorvastatin 10 mg by mouth once daily Patient is taking aspirin 325 mg daily as needed for pain Intolerances: none Taking medications as directed: no, patient reports he stopped taking since he did not know what his cholesterol level was and does not want to take medications if he doesn't know what they are for or if they are necessary Side effects thought to be attributed to current medication regimen: no Encourage dietary reduction of high fat containing foods such as butter, nuts, bacon, egg yolks, etc. Reviewed risks of hyperlipidemia, principles of treatment and consequences of untreated hyperlipidemia Discussed need for medication compliance Patient counseled on smoking cessation Patient instructed to restart atorvastatin 10 mg by mouth daily. Patient verbalized understanding. Given extremely high ASCVD risk, may be reasonable to increase to high intensity statin. Will discuss more at future  visits.  Suggested that the patient decrease aspirin to 81 mg by mouth daily for general cardiovascular benefit but to lower risk of bleeding associated with 325 mg dose  Tobacco Abuse (Current Smoker) - New goal.: Patient endorses that he currently smokes ~20 cigarettes per day which is less than before. He was previously smoking ~30 per day Patient reports he has 21 mg nicotine patches at home but feels like this is too much nicotine. Suggested he consider decreasing to 14 mg patch and try to cut down on smoking.  Advised patient to quit smoking and offered support  Anxiety - New goal.: Uncontrolled per patient. Current medications: escitalopram 10 mg by mouth daily  Patient reports starting escitalopram recently  Patient Goals/Self-Care Activities Patient will:  Focus on medication adherence by keeping up with prescription refills and either using a pill box or reminders to take your medications at the prescribed times Check blood pressure at least once daily, document, and provide at future appointments  Follow Up Plan: Face to Face appointment with care management team member scheduled for: 11/29/21      Medication Assistance: None required.  Patient affirms current coverage meets needs.  Patient's preferred pharmacy is:  [facility] [address] [place] - [place] [address] [place] [address] [place] [address] Phone: [phone] Fax: [phone]  Follow Up:  Patient agrees to Care Plan and Follow-up.  Plan: Face to Face appointment with care management team member scheduled for: 11/29/21  Kennon Holter, PharmD, [place], [place] Clinical Pharmacist Practitioner [facility] [phone]

## 2021-11-02 NOTE — Patient Instructions (Addendum)
Brett Elliott,  It was great to talk to you today!  Please call me with any questions or concerns.   Visit Information   Following is a copy of your full care plan:  Care Plan : Medication Management  Updates made by Beryle Lathe, [place] since 11/02/2021 12:00 AM     Problem: HTN, HLD, Tobacco Use, Anxiety   Priority: High  Onset Date: 11/02/2021     Long-Range Goal: Disease Progression Prevention   Start Date: 11/02/2021  Expected End Date: 01/31/2022  This Visit's Progress: On track  Priority: High  Note:   Current Barriers:  Unable to independently monitor therapeutic efficacy Unable to achieve control of hypertension, hyperlipidemia, and smoking cessation  Pharmacist Clinical Goal(s):  Patient will Achieve adherence to monitoring guidelines and medication adherence to achieve therapeutic efficacy Achieve control of hypertension, hyperlipidemia, and smoking cessation as evidenced by improved blood pressure control, improved LDL, improved triglycerides, and reduced smoking through collaboration with PharmD and provider.   Interventions: 1:1 collaboration with Lindell Spar, MD regarding development and update of comprehensive plan of care as evidenced by provider attestation and co-signature Inter-disciplinary care team collaboration (see longitudinal plan of care) Comprehensive medication review performed; medication list updated in electronic medical record  Hypertension - New goal.: Blood pressure under poor control. Blood pressure is above goal of <130/80 mmHg per 2017 AHA/ACC guidelines. Current medications: carvedilol 6.25 mg by mouth twice daily and olmesartan-amlodipine-hctz 20-5-12.5 mg by mouth daily Intolerances:  patient reports he can't function while taking olmesartan-amlodipine-hctz and is refusing to take  Taking medications as directed: no, patient refusing to take olmesartan-amlodipine-hctz. Patient reports he just started taking carvedilol yesterday  and is working on getting in the habit of taking medications on a schedule Side effects thought to be attributed to current medication regimen:  unclear Current exercise: not discussed today Current home blood pressure: patient does not have a blood pressure machine at this time. He plans to see if [facility] will cover one for him. Otherwise, patient was instructed to get Omrom blood pressure machine at [facility] for ~$35 Continue carvedilol 6.25 mg by mouth twice daily Discussed with PCP and will add telmisartan 40 mg by mouth once daily Discussed with PCP and will discontinue olmesartan-amlodipine-hctz since patient is refusing to take due to "intolerance" Encourage dietary sodium restriction/DASH diet Discussed need for medication compliance Reviewed risks of hypertension, principles of treatment and consequences of untreated hypertension Patient counseled on smoking cessation Will follow-up in 4 weeks to check BP and renal function.   Hyperlipidemia - New goal.: Uncontrolled. LDL above goal of <70 due to very high risk given 10-year risk >20% per 2020 AACE/ACE guidelines. Triglycerides above goal of <150 per 2020 AACE/ACE guidelines. Current medications: atorvastatin 10 mg by mouth once daily Patient is taking aspirin 325 mg daily as needed for pain Intolerances: none Taking medications as directed: no, patient reports he stopped taking since he did not know what his cholesterol level was and does not want to take medications if he doesn't know what they are for or if they are necessary Side effects thought to be attributed to current medication regimen: no Encourage dietary reduction of high fat containing foods such as butter, nuts, bacon, egg yolks, etc. Reviewed risks of hyperlipidemia, principles of treatment and consequences of untreated hyperlipidemia Discussed need for medication compliance Patient counseled on smoking cessation Patient instructed to restart atorvastatin 10 mg by mouth  daily. Patient verbalized understanding. Given extremely high ASCVD risk, may  be reasonable to increase to high intensity statin. Will discuss more at future visits.  Suggested that the patient decrease aspirin to 81 mg by mouth daily for general cardiovascular benefit but to lower risk of bleeding associated with 325 mg dose  Tobacco Abuse (Current Smoker) - New goal.: Patient endorses that he currently smokes ~20 cigarettes per day which is less than before. He was previously smoking ~30 per day Patient reports he has 21 mg nicotine patches at home but feels like this is too much nicotine. Suggested he consider decreasing to 14 mg patch and try to cut down on smoking.  Advised patient to quit smoking and offered support  Anxiety - New goal.: Uncontrolled per patient. Current medications: escitalopram 10 mg by mouth daily  Patient reports starting escitalopram recently  Patient Goals/Self-Care Activities Patient will:  Focus on medication adherence by keeping up with prescription refills and either using a pill box or reminders to take your medications at the prescribed times Check blood pressure at least once daily, document, and provide at future appointments  Follow Up Plan: Face to Face appointment with care management team member scheduled for: 11/29/21      Consent to CCM Services: [name] was given information about Chronic Care Management services including:  CCM service includes personalized support from designated clinical staff supervised by his physician, including individualized plan of care and coordination with other care providers 24/7 contact phone numbers for assistance for urgent and routine care needs. Service will only be billed when office clinical staff spend 20 minutes or more in a month to coordinate care. Only one practitioner may furnish and bill the service in a calendar month. The patient may stop CCM services at any time (effective at the end of the month)  by phone call to the office staff. The patient will be responsible for cost sharing (co-pay) of up to 20% of the service fee (after annual deductible is met).  Patient agreed to services and verbal consent obtained.   Plan: Face to Face appointment with care management team member scheduled for: 11/29/21  Kennon Holter, PharmD, BCACP, CPP Clinical Pharmacist Practitioner [facility] [phone]   Please call the care guide team at [phone] if you need to cancel or reschedule your appointment.   Patient verbalizes understanding of instructions provided today and agrees to view in [place].

## 2021-11-28 IMAGING — MR MR MRA HEAD W/O CM
2 series · 19 of 48 positions shown · non-contrast
Comparison: CTA head and neck 05/21/2019

CLINICAL DATA: Follow-up cerebral aneurysm with endovascular
treatment.

EXAM:
MRI HEAD WITHOUT CONTRAST
MRA HEAD WITHOUT CONTRAST
TECHNIQUE: Multiplanar, multiecho pulse sequences of the brain and surrounding
structures were obtained without intravenous contrast. Angiographic
images of the head were obtained using MRA technique without
contrast.

[Series 3: ax (id) · axial · 1.0mm · 0.43mm/px · z∈[-57,+32]mm · 16 of 194 slices shown]
[im 1/194]
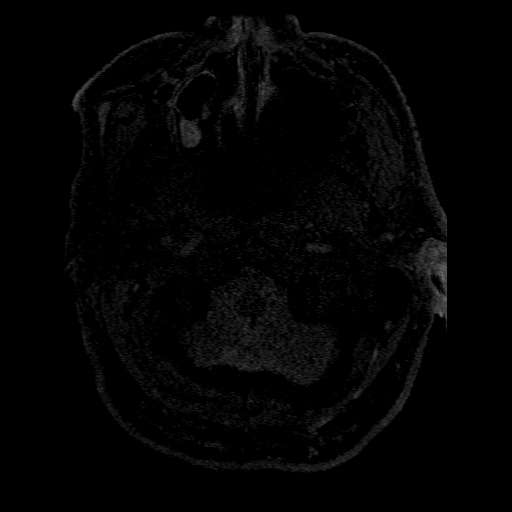
[im 5/194]
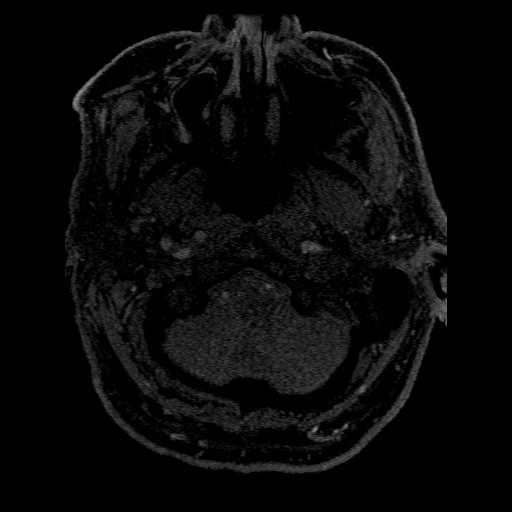
[im 9/194]
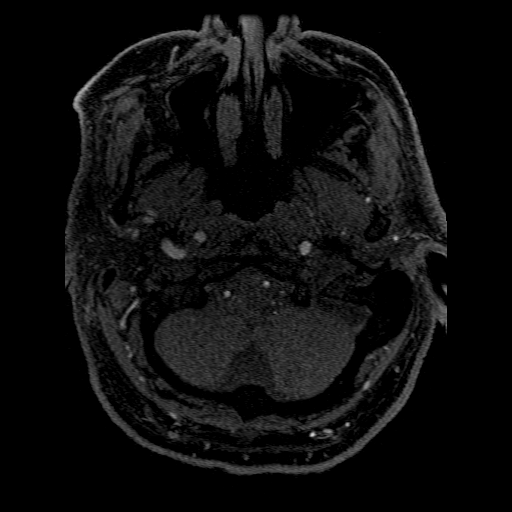
[im 14/194]
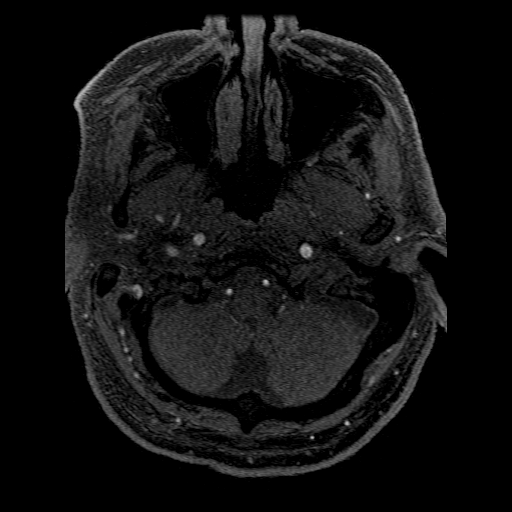
[im 18/194]
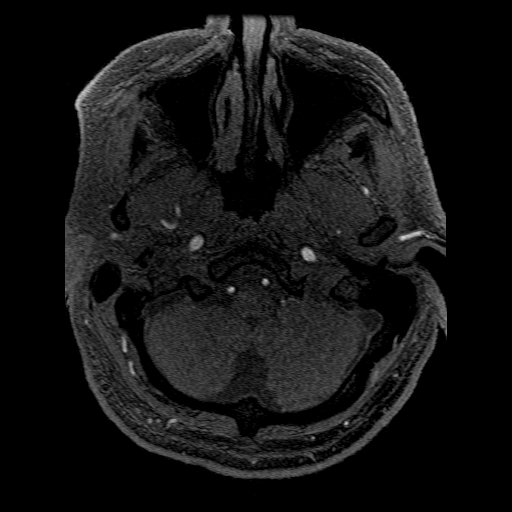
[im 22/194]
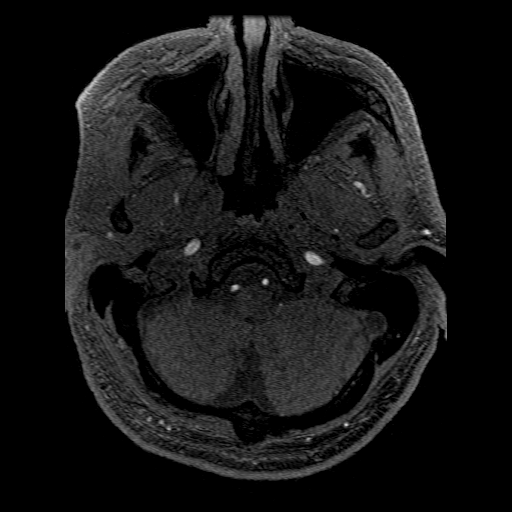
[im 31/194]
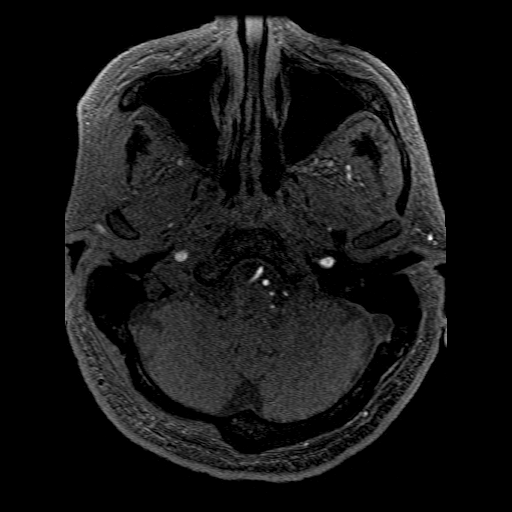
[im 36/194]
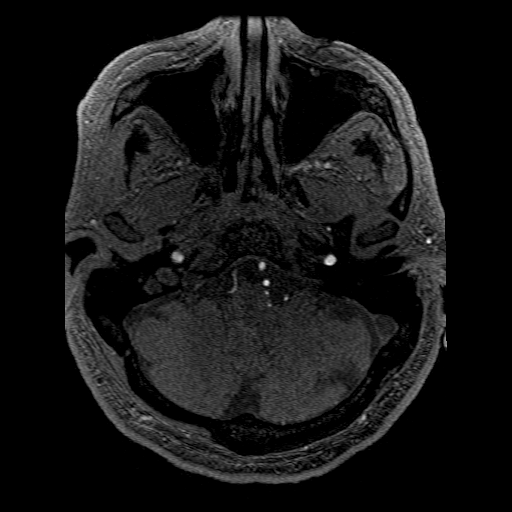
[im 62/194]
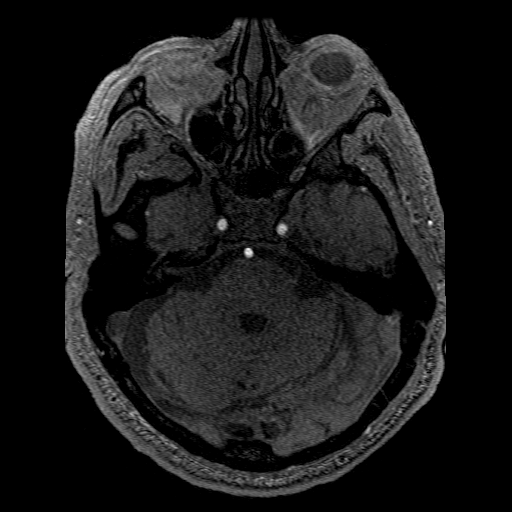
[im 84/194]
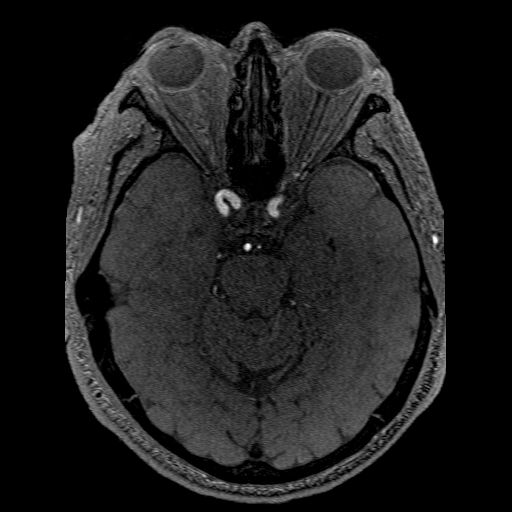
[im 97/194]
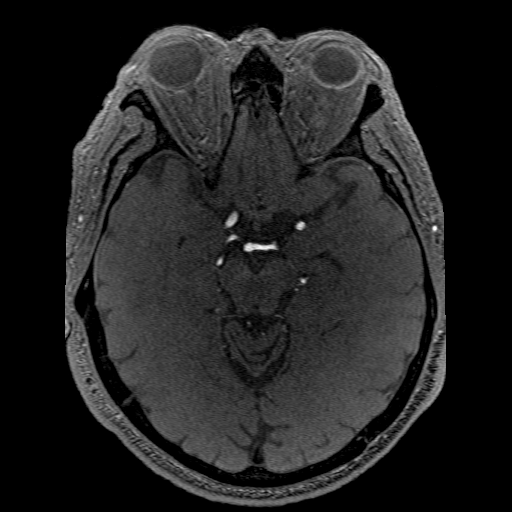
[im 110/194]
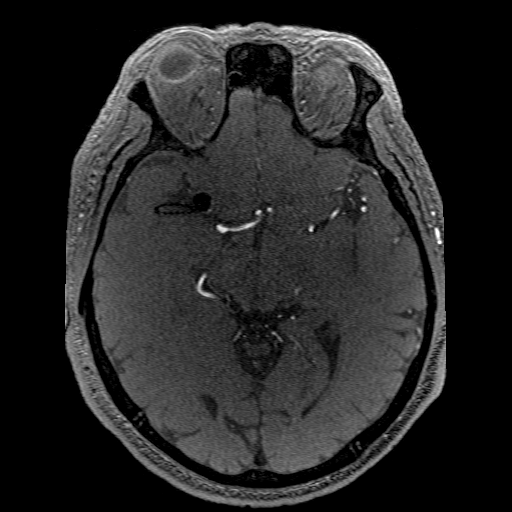
[im 132/194]
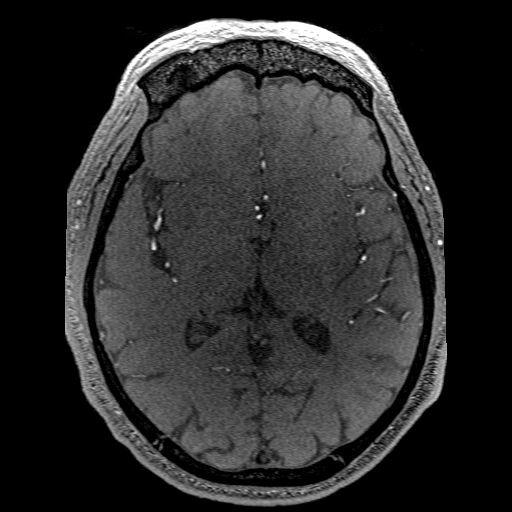
[im 158/194]
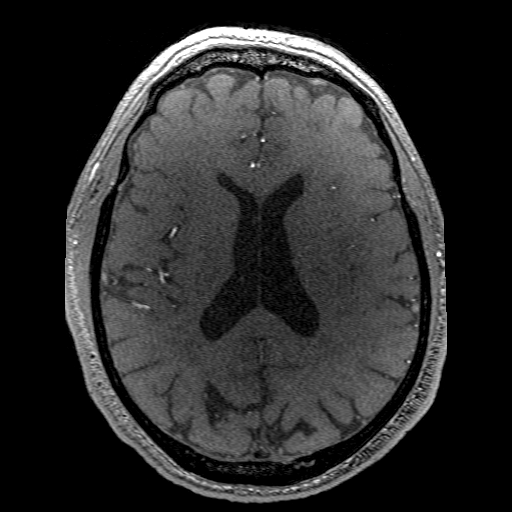
[im 163/194]
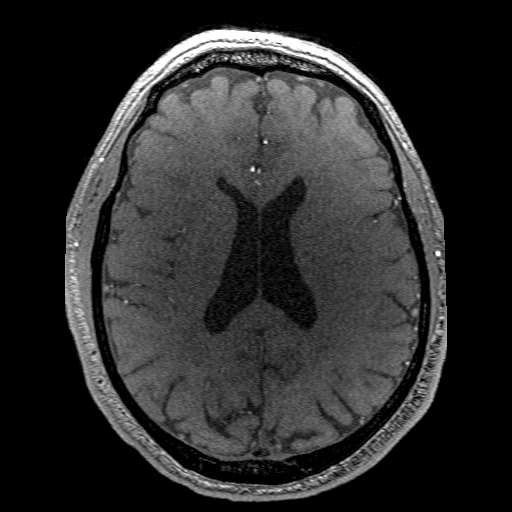
[im 185/194]
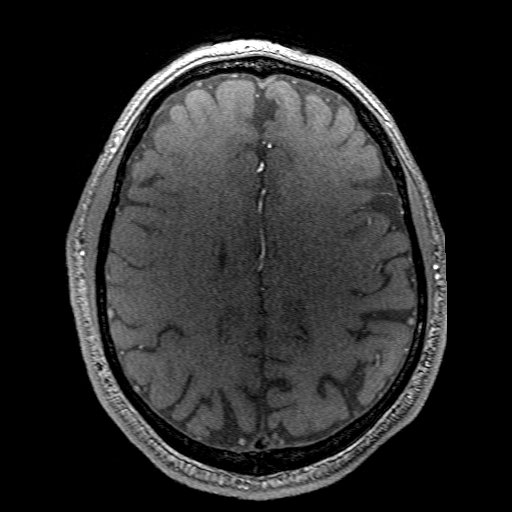

[Series 301: pjn:ax (id) · sagittal · 1.0mm · 0.43mm/px · 3 of 15 slices shown]
[im 1/15]
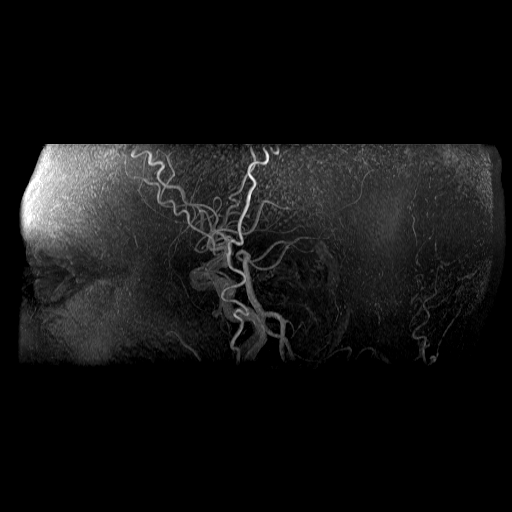
[im 8/15]
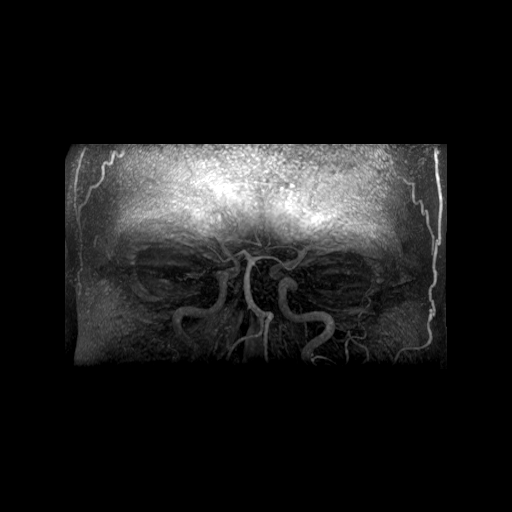
[im 15/15]
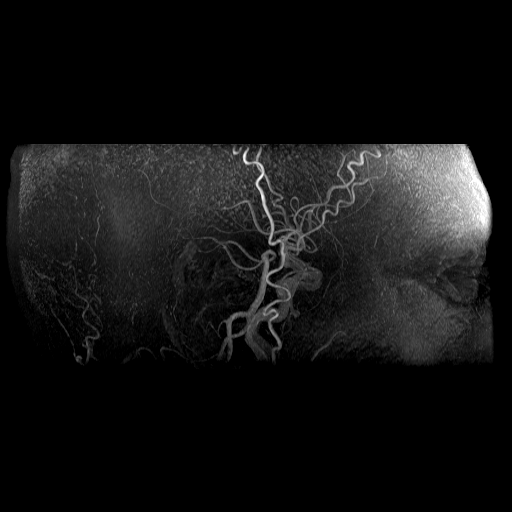

[19 of 48 positions shown; findings below may reference images not displayed]

FINDINGS: MRI HEAD FINDINGS

Very limited study. Diffusion-weighted imaging was obtained which is
normal. The patient refused further imaging.

MRA HEAD FINDINGS

Coiling and stenting of right MCA aneurysm. Small amount of flow
related signal in the base of the aneurysm measuring approximately 1
x 2 mm, best seen on the source images. The stent is patent with
flow in the right MCA branches.

No other aneurysm identified.

Both vertebral arteries patent to the basilar. Left PICA patent.
Right PICA not visualized. Prominent right AICA patent. Basilar
widely patent. Superior cerebellar and posterior cerebral arteries
patent bilaterally

Cavernous carotid widely patent bilaterally. Both anterior cerebral
arteries patent. Left middle cerebral artery patent and normal.
IMPRESSION: Stent assisted coiling of right MCA aneurysm. Small amount of flow
related signal in the base of the aneurysm. The stent is patent. No
other aneurysm

Very limited MRI of the brain.  Negative diffusion-weighted imaging.

## 2021-11-29 ENCOUNTER — Ambulatory Visit: Payer: Medicare HMO

## 2021-12-20 ENCOUNTER — Ambulatory Visit: Payer: Medicare HMO

## 2021-12-21 ENCOUNTER — Other Ambulatory Visit: Payer: Self-pay

## 2021-12-21 ENCOUNTER — Ambulatory Visit (INDEPENDENT_AMBULATORY_CARE_PROVIDER_SITE_OTHER): Payer: Medicare HMO | Admitting: Pharmacist

## 2021-12-21 VITALS — BP 180/94 | HR 63

## 2021-12-21 DIAGNOSIS — I10 Essential (primary) hypertension: Secondary | ICD-10-CM

## 2021-12-21 DIAGNOSIS — Z720 Tobacco use: Secondary | ICD-10-CM

## 2021-12-21 DIAGNOSIS — F419 Anxiety disorder, unspecified: Secondary | ICD-10-CM

## 2021-12-21 DIAGNOSIS — E782 Mixed hyperlipidemia: Secondary | ICD-10-CM

## 2021-12-21 NOTE — Chronic Care Management (AMB) (Signed)
Chronic Care Management Pharmacy Note  12/21/2021 Name:  Brett Elliott MRN:  [id number] DOB:  19-Sep-1946  Summary: Hypertension Blood pressure under poor control. Factors affecting control of BP include poor adherence to medication(s). Blood pressure is above goal of <130/80 mmHg per 2017 AHA/ACC guidelines. Patient reports intermittently taking his blood pressure medications but does not take them every day. He did not take them today or yesterday. Current home blood pressure: patient does not have a blood pressure machine at this time. Wife concerned that he would check blood pressure obsessively and could increase anxiety. They plan to see if [facility] will cover one for him. Otherwise, patient was instructed to get Omrom blood pressure machine at [facility] for ~$35 Spent significant time discussing the need for medication adherence.  Continue carvedilol 6.25 mg by mouth twice daily Continue telmisartan 40 mg by mouth once daily. Recommend to check BMP in 1 month near appointment with primary care provider. Follow-up on adherence and blood pressure control in 5 weeks with PCP and 8 weeks with me.   Hyperlipidemia Uncontrolled likely due to poor compliance with prescribed regimen. LDL above goal of <70 due to very high risk given 10-year risk >20% per 2020 AACE/ACE guidelines. Triglycerides above goal of <150 per 2020 AACE/ACE guidelines. Patient reports he is only taking atorvastatin sometimes as he does not feel like his cholesterol is as bad as it was previously Spent significant time discussing the need for medication adherence. Patient instructed to continue atorvastatin 20 mg by mouth daily. Given extremely high ASCVD risk, may be reasonable to increase to high intensity statin. Will further assess after next cholesterol panel.    Subjective: Brett Elliott is an 76 y.o. year old male who is a primary patient of Lindell Spar, MD.  The CCM team was consulted for assistance with  disease management and care coordination needs.    Engaged with patient and wife Brett Elliott face to face for follow up visit in response to provider referral for pharmacy case management and/or care coordination services.   Consent to Services:  The patient was given information about Chronic Care Management services, agreed to services, and gave verbal consent prior to initiation of services.  Please see initial visit note for detailed documentation.   Patient Care Team: Lindell Spar, MD as PCP - General (Internal Medicine) Eloise Harman, DO as Consulting Physician (Gastroenterology) Beryle Lathe, [facility] (Pharmacist)  Objective:  Lab Results  Component Value Date   CREATININE 0.96 10/31/2021   CREATININE 0.94 03/28/2021   CREATININE 0.80 05/21/2019    Lab Results  Component Value Date   HGBA1C 5.7 (H) 10/31/2021   Last diabetic Eye exam: No results found for: HMDIABEYEEXA  Last diabetic Foot exam: No results found for: HMDIABFOOTEX      Component Value Date/Time   CHOL 147 03/28/2021 1419   TRIG 162 (H) 03/28/2021 1419   HDL 32 (L) 03/28/2021 1419   CHOLHDL 4.6 03/28/2021 1419   LDLCALC 87 03/28/2021 1419    Hepatic Function Latest Ref Rng & Units 10/31/2021 03/28/2021 05/22/2017  Total Protein 6.0 - 8.5 g/dL 6.9 6.7 7.0  Albumin 3.7 - 4.7 g/dL 4.2 4.2 4.0  AST 0 - 40 IU/L 19 23 32  ALT 0 - 44 IU/L '8 12 18  ' Alk Phosphatase 44 - 121 IU/L 89 95 84  Total Bilirubin 0.0 - 1.2 mg/dL 0.4 0.3 0.5    Lab Results  Component Value Date/Time   TSH  1.050 03/28/2021 02:19 PM   FREET4 1.12 03/28/2021 02:19 PM    CBC Latest Ref Rng & Units 10/31/2021 03/28/2021 05/22/2017  WBC 3.4 - 10.8 x10E3/uL 10.8 6.6 10.5  Hemoglobin 13.0 - 17.7 g/dL 15.6 15.7 13.4  Hematocrit 37.5 - 51.0 % 46.0 47.7 39.1  Platelets 150 - 450 x10E3/uL 264 205 275    Lab Results  Component Value Date/Time   VD25OH 80.4 10/31/2021 04:32 PM   VD25OH 47.9 03/28/2021 02:19 PM    Clinical ASCVD:  No  The 10-year ASCVD risk score (Arnett DK, et al., 2019) is: 50.6%   Values used to calculate the score:     Age: 70 years     Sex: Male     Is Non-Hispanic African American: No     Diabetic: No     Tobacco smoker: Yes     Systolic Blood Pressure: 789 mmHg     Is BP treated: Yes     HDL Cholesterol: 32 mg/dL     Total Cholesterol: 147 mg/dL    Social History   Tobacco Use  Smoking Status Every Day   Packs/day: 1.00   Types: Cigarettes  Smokeless Tobacco Never   BP Readings from Last 3 Encounters:  12/21/21 (!) 180/94  10/31/21 (!) 172/88  07/28/21 (!) 162/82   Pulse Readings from Last 3 Encounters:  12/21/21 63  10/31/21 88  07/28/21 75   Wt Readings from Last 3 Encounters:  10/31/21 215 lb 1.3 oz (97.6 kg)  07/28/21 214 lb (97.1 kg)  06/28/21 210 lb 1.9 oz (95.3 kg)    Assessment: Review of patient past medical history, allergies, medications, health status, including review of consultants reports, laboratory and other test data, was performed as part of comprehensive evaluation and provision of chronic care management services.   SDOH:  (Social Determinants of Health) assessments and interventions performed:    CCM Care Plan  Allergies  Allergen Reactions   Cephalexin Hives and Rash    Medications Reviewed Today     Reviewed by Beryle Lathe, [facility] (Pharmacist) on 12/21/21 at 1417  Med List Status: <None>   Medication Order Taking? Sig Documenting Provider Last Dose Status Informant  aspirin EC 325 MG tablet [id number] Yes Take 325 mg by mouth daily as needed for mild pain or moderate pain. [provider] Taking Active Spouse/Significant Other  atorvastatin (LIPITOR) 20 MG tablet [id number] No Take 20 mg by mouth daily.  Patient not taking: Reported on 12/21/2021   [provider] Not Taking Active            Med Note Vernell Barrier Dec 21, 2021  2:06 PM) Taking sometimes  carvedilol (COREG) 6.25 MG tablet [id number]  Yes Take 1 tablet (6.25 mg total) by mouth 2 (two) times daily with a meal. Lindell Spar, MD Taking Active   celecoxib (CELEBREX) 100 MG capsule [id number] No Take 100 mg by mouth 2 (two) times daily as needed.  Patient not taking: Reported on 12/21/2021   [provider] Not Taking Active   escitalopram (LEXAPRO) 10 MG tablet [id number] Yes Take 1 tablet (10 mg total) by mouth daily. Lindell Spar, MD Taking Active   imiquimod [place]) 5 % cream [id number] No Apply topically 3 (three) times a week.  Patient not taking: Reported on 11/02/2021   Lindell Spar, MD Not Taking Active   telmisartan (MICARDIS) 40 MG tablet [id number] Yes Take 1 tablet (40 mg total) by mouth daily.  Lindell Spar, MD Taking Active             Patient Active Problem List   Diagnosis Date Noted   Prostate cancer screening 10/31/2021   Chronic nonintractable headache 07/29/2021   Angina pectoris ([place]) 07/29/2021   History of cerebral aneurysm repair 07/29/2021   Genital warts 03/10/2021   Anxiety 03/10/2021   OA (osteoarthritis) 03/10/2021   Hyperlipidemia 05/29/2017   Hypertension 05/29/2017   Tobacco abuse 05/29/2017   Major depressive disorder, single episode, severe without psychotic features ([place]) 05/22/2017    Immunization History  Administered Date(s) Administered   Fluad Quad(high Dose 65+) 07/28/2021   Moderna Sars-Covid-2 Vaccination 01/21/2020, 02/16/2020   PNEUMOCOCCAL CONJUGATE-20 10/31/2021    Conditions to be addressed/monitored: HTN, HLD, Anxiety, and Tobacco Use  Care Plan : Medication Management  Updates made by Beryle Lathe, [place] since 12/21/2021 12:00 AM     Problem: HTN, HLD, Tobacco Use, Anxiety   Priority: High  Onset Date: 11/02/2021     Long-Range Goal: Disease Progression Prevention   Start Date: 11/02/2021  Expected End Date: 01/31/2022  Recent Progress: On track  Priority: High  Note:   Current Barriers:  Unable to independently monitor  therapeutic efficacy Unable to achieve control of hypertension, hyperlipidemia, and smoking cessation  Pharmacist Clinical Goal(s):  Through collaboration with PharmD and provider, patient will:  Achieve adherence to monitoring guidelines and medication adherence to achieve therapeutic efficacy Achieve control of hypertension, hyperlipidemia, and smoking cessation as evidenced by improved blood pressure control, improved LDL, improved triglycerides, and reduced smoking  Interventions: 1:1 collaboration with Lindell Spar, MD regarding development and update of comprehensive plan of care as evidenced by provider attestation and co-signature Inter-disciplinary care team collaboration (see longitudinal plan of care) Comprehensive medication review performed; medication list updated in electronic medical record  Hypertension - Goal on track: NO.: Blood pressure under poor control. Factors affecting control of BP include poor adherence to medication(s). Blood pressure is above goal of <130/80 mmHg per 2017 AHA/ACC guidelines. Current medications: telmisartan 40 mg by mouth once daily and carvedilol 6.25 mg by mouth twice daily Intolerances:  olmesartan-amlodipine-hctz (patient reports he "can't function" and refuses to take) Taking medications as directed: no, patient reports intermittently taking his blood pressure medications but does not take them every day. He did not take them today or yesterday. Side effects thought to be attributed to current medication regimen: no Current exercise: not discussed today Current home blood pressure: patient does not have a blood pressure machine at this time. Wife concerned that he would check blood pressure obsessively and could increase anxiety. They plan to see if [facility] will cover one for him. Otherwise, patient was instructed to get Omrom blood pressure machine at [facility] for ~$35 Spent significant time discussing the need for medication adherence.   Continue carvedilol 6.25 mg by mouth twice daily Continue telmisartan 40 mg by mouth once daily. Recommend to check BMP in 1 month near appointment with primary care provider. Follow-up on adherence and blood pressure control in 5 weeks with PCP and 8 weeks with me.  Encourage dietary sodium restriction/DASH diet Reviewed risks of hypertension, principles of treatment and consequences of untreated hypertension Patient counseled on smoking cessation  Hyperlipidemia - Goal on track: NO.: Uncontrolled likely due to poor compliance with prescribed regimen. LDL above goal of <70 due to very high risk given 10-year risk >20% per 2020 AACE/ACE guidelines. Triglycerides above goal of <150 per 2020 AACE/ACE guidelines. Current medications: atorvastatin  20 mg by mouth once daily Patient is taking aspirin 325 mg daily as needed for pain Intolerances: none Taking medications as directed: no, patient reports he is only taking atorvastatin sometimes as he does not feel like his cholesterol is as bad as it was previously Side effects thought to be attributed to current medication regimen: no Encourage dietary reduction of high fat containing foods such as butter, nuts, bacon, egg yolks, etc. Reviewed risks of hyperlipidemia, principles of treatment and consequences of untreated hyperlipidemia Discussed need for medication compliance Patient counseled on smoking cessation Spent significant time discussing the need for medication adherence. Patient instructed to continue atorvastatin 20 mg by mouth daily. Given extremely high ASCVD risk, may be reasonable to increase to high intensity statin. Will further assess after next cholesterol panel.   Suggested that the patient decrease aspirin to 81 mg by mouth daily for general cardiovascular benefit but to lower risk of bleeding associated with 325 mg dose  Tobacco Abuse (Current Smoker) - Condition stable. Not addressed this visit.: Patient endorses that he  currently smokes ~20 cigarettes per day which is less than before. He was previously smoking ~30 per day  Advised patient to quit smoking and offered support  Anxiety - Condition stable. Not addressed this visit.: Stable but patient reports poor adherence to medication Current medications: escitalopram 10 mg by mouth daily   Patient Goals/Self-Care Activities Patient will:  Focus on medication adherence by keeping up with prescription refills and either using a pill box or reminders to take your medications at the prescribed times Check blood pressure at least once daily, document, and provide at future appointments  Follow Up Plan: Face to Face appointment with care management team member scheduled for: 02/22/22     Medication Assistance: None required.  Patient affirms current coverage meets needs.  Patient's preferred pharmacy is:  [facility] [address] [place] - [place] [address] [place] [address] [place] [address] Phone: [phone] Fax: [phone]  Follow Up:  Patient agrees to Care Plan and Follow-up.  Plan: Face to Face appointment with care management team member scheduled for: 02/22/22  Kennon Holter, PharmD, [place], CPP Clinical Pharmacist Practitioner [facility] [phone]

## 2021-12-21 NOTE — Patient Instructions (Signed)
Brett Elliott,  It was great to talk to you today!  Please call me with any questions or concerns.  Visit Information  Following are the goals we discussed today:   Goals Addressed             This Visit's Progress    Medication Management       Patient Goals/Self-Care Activities Patient will:  Focus on medication adherence by keeping up with prescription refills and either using a pill box or reminders to take your medications at the prescribed times Check blood pressure at least once daily, document, and provide at future appointments         Follow-up plan: Face to Face appointment with care management team member scheduled for: 02/22/22  Print copy of patient instructions, educational materials, and care plan provided in person.   Please call the care guide team at [phone] if you need to cancel or reschedule your appointment.   Domenic Moras, PharmD, [place], CPP Clinical Pharmacist Practitioner [facility] [phone]

## 2021-12-27 DIAGNOSIS — E782 Mixed hyperlipidemia: Secondary | ICD-10-CM

## 2021-12-27 DIAGNOSIS — I10 Essential (primary) hypertension: Secondary | ICD-10-CM

## 2022-01-30 ENCOUNTER — Ambulatory Visit: Payer: Medicare HMO | Admitting: Internal Medicine

## 2022-01-31 ENCOUNTER — Other Ambulatory Visit: Payer: Self-pay

## 2022-01-31 ENCOUNTER — Encounter: Payer: Self-pay | Admitting: Internal Medicine

## 2022-01-31 ENCOUNTER — Ambulatory Visit (INDEPENDENT_AMBULATORY_CARE_PROVIDER_SITE_OTHER): Payer: Medicare HMO | Admitting: Internal Medicine

## 2022-01-31 VITALS — BP 138/88 | HR 69 | Resp 18 | Ht 68.0 in | Wt 208.0 lb

## 2022-01-31 DIAGNOSIS — Z0001 Encounter for general adult medical examination with abnormal findings: Secondary | ICD-10-CM

## 2022-01-31 DIAGNOSIS — Z720 Tobacco use: Secondary | ICD-10-CM

## 2022-01-31 DIAGNOSIS — J449 Chronic obstructive pulmonary disease, unspecified: Secondary | ICD-10-CM | POA: Diagnosis not present

## 2022-01-31 DIAGNOSIS — F419 Anxiety disorder, unspecified: Secondary | ICD-10-CM | POA: Diagnosis not present

## 2022-01-31 DIAGNOSIS — E559 Vitamin D deficiency, unspecified: Secondary | ICD-10-CM

## 2022-01-31 DIAGNOSIS — I10 Essential (primary) hypertension: Secondary | ICD-10-CM | POA: Diagnosis not present

## 2022-01-31 DIAGNOSIS — E782 Mixed hyperlipidemia: Secondary | ICD-10-CM | POA: Diagnosis not present

## 2022-01-31 DIAGNOSIS — Z125 Encounter for screening for malignant neoplasm of prostate: Secondary | ICD-10-CM | POA: Diagnosis not present

## 2022-01-31 DIAGNOSIS — F322 Major depressive disorder, single episode, severe without psychotic features: Secondary | ICD-10-CM

## 2022-01-31 DIAGNOSIS — Z1211 Encounter for screening for malignant neoplasm of colon: Secondary | ICD-10-CM

## 2022-01-31 DIAGNOSIS — Z0000 Encounter for general adult medical examination without abnormal findings: Secondary | ICD-10-CM

## 2022-01-31 MED ORDER — ALBUTEROL SULFATE HFA 108 (90 BASE) MCG/ACT IN AERS: 2.0000 | INHALATION_SPRAY | Freq: Four times a day (QID) | RESPIRATORY_TRACT | 5 refills | Status: DC | PRN

## 2022-01-31 MED ORDER — ATORVASTATIN CALCIUM 20 MG PO TABS: 20.0000 mg | ORAL_TABLET | Freq: Every day | ORAL | 1 refills | Status: DC

## 2022-01-31 MED ORDER — TELMISARTAN 40 MG PO TABS: 40.0000 mg | ORAL_TABLET | Freq: Every day | ORAL | 1 refills | Status: DC

## 2022-01-31 MED ORDER — ESCITALOPRAM OXALATE 10 MG PO TABS: 10.0000 mg | ORAL_TABLET | Freq: Every day | ORAL | 1 refills | Status: DC

## 2022-01-31 NOTE — Assessment & Plan Note (Signed)
Continue Atorvastatin Lipid profile reviewed 

## 2022-01-31 NOTE — Patient Instructions (Signed)
Please continue taking medications as prescribed. ? ?Please continue to follow low salt diet and ambulate as tolerated. ? ?Please try to quit smoking soon. ? ?Please use Albuterol inhaler as needed for shortness of breath or wheezing. ?

## 2022-01-31 NOTE — Assessment & Plan Note (Signed)
Ordered PSA after discussing its limitations for prostate cancer screening, including false positive results leading additional investigations. 

## 2022-01-31 NOTE — Assessment & Plan Note (Signed)
Smokes about 1 pack/day  Asked about quitting: confirms that he currently smokes cigarettes Advise to quit smoking: Educated about QUITTING to reduce the risk of cancer, cardio and cerebrovascular disease. Assess willingness: Unwilling to quit at this time, but is working on cutting back. Assist with counseling and pharmacotherapy: Counseled for 5 minutes and literature provided. Nicotine patch prescribed. Arrange for follow up: follow up in 3 months and continue to offer help. 

## 2022-01-31 NOTE — Assessment & Plan Note (Signed)
BP Readings from Last 1 Encounters:  ?01/31/22 138/88  ? ?Well-controlled with Telmisartan and Coreg now ?Had added Coreg considering his risk for CAD ?Counseled for compliance with the medications ?Advised DASH diet and moderate exercise/walking, at least 150 mins/wee ?Needs to quit smoking and be compliant to medications ? ?Refer to CCM for medication adherence counseling ?

## 2022-01-31 NOTE — Progress Notes (Signed)
Established Patient Office Visit  Subjective:  Patient ID: Brett Elliott, male    DOB: 07-Jan-1946  Age: 76 y.o. MRN: [id number]  CC:  Chief Complaint  Patient presents with   Follow-up    3 month follow up HTN     HPI Brett Elliott is a 76 y.o. male with past medical history of hypertension, cerebral aneurysm s/p endovascular coiling and hyperlipidemia, anxiety, tobacco abuse, osteoarthritis and chronic genital wart who presents for f/u of her chronic medical conditions.  HTN: BP is well-controlled now. Takes medications regularly. Patient denies headache, dizziness, chest pain, dyspnea or palpitations.  His wife reports that his anxiety is improved with the Lexapro now.  Denies any anhedonia, SI or HI.  He does not have severe anger spells now.  He still smokes about 1 pack/day.  He is wheezing today.  He also reports coughing spells with clear sputum at times.  Denies any hemoptysis.     Past Medical History:  Diagnosis Date   Anxiety    Brain aneurysm    Hypertension     Past Surgical History:  Procedure Laterality Date   BRAIN SURGERY     CATARACT EXTRACTION W/ INTRAOCULAR LENS IMPLANT Bilateral    IR RADIOLOGIST EVAL & MGMT  07/03/2017    History reviewed. No pertinent family history.  Social History   Socioeconomic History   Marital status: Married    Spouse name: Not on file   Number of children: Not on file   Years of education: Not on file   Highest education level: Not on file  Occupational History   Not on file  Tobacco Use   Smoking status: Every Day    Packs/day: 1.00    Types: Cigarettes   Smokeless tobacco: Never  Substance and Sexual Activity   Alcohol use: No   Drug use: No   Sexual activity: Not on file  Other Topics Concern   Not on file  Social History Narrative   Not on file   Social Determinants of Health   Financial Resource Strain: Medium Risk   Difficulty of Paying Living Expenses: Somewhat hard  Food Insecurity: No Food  Insecurity   Worried About Running Out of Food in the Last Year: Never true   Ran Out of Food in the Last Year: Never true  Transportation Needs: No Transportation Needs   Lack of Transportation (Medical): No   Lack of Transportation (Non-Medical): No  Physical Activity: Inactive   Days of Exercise per Week: 0 days   Minutes of Exercise per Session: 0 min  Stress: No Stress Concern Present   Feeling of Stress : Only a little  Social Connections: Moderately Isolated   Frequency of Communication with Friends and Family: More than three times a week   Frequency of Social Gatherings with Friends and Family: More than three times a week   Attends Religious Services: Never   Marine scientist or Organizations: No   Attends Music therapist: Never   Marital Status: Married  Human resources officer Violence: Not At Risk   Fear of Current or Ex-Partner: No   Emotionally Abused: No   Physically Abused: No   Sexually Abused: No    Outpatient Medications Prior to Visit  Medication Sig Dispense Refill   aspirin EC 325 MG tablet Take 325 mg by mouth daily as needed for mild pain or moderate pain.     carvedilol (COREG) 6.25 MG tablet Take 1 tablet (6.25 mg total)  by mouth 2 (two) times daily with a meal. 60 tablet 3   imiquimod (ALDARA) 5 % cream Apply topically 3 (three) times a week. 12 each 0   atorvastatin (LIPITOR) 20 MG tablet Take 20 mg by mouth daily.     escitalopram (LEXAPRO) 10 MG tablet Take 1 tablet (10 mg total) by mouth daily. 30 tablet 2   telmisartan (MICARDIS) 40 MG tablet Take 1 tablet (40 mg total) by mouth daily. 30 tablet 2   celecoxib (CELEBREX) 100 MG capsule Take 100 mg by mouth 2 (two) times daily as needed. (Patient not taking: Reported on 12/21/2021)     No facility-administered medications prior to visit.    Allergies  Allergen Reactions   Cephalexin Hives and Rash    ROS Review of Systems  Constitutional:  Negative for chills and fever.  HENT:   Negative for congestion and sore throat.   Eyes:  Negative for pain and discharge.  Respiratory:  Positive for cough and shortness of breath.   Cardiovascular:  Negative for chest pain and palpitations.  Gastrointestinal:  Negative for constipation, diarrhea, nausea and vomiting.  Endocrine: Negative for polydipsia and polyuria.  Genitourinary:  Negative for dysuria and hematuria.  Musculoskeletal:  Negative for neck pain and neck stiffness.  Skin:  Negative for rash.  Neurological:  Negative for dizziness, weakness and numbness.  Psychiatric/Behavioral:  Positive for sleep disturbance. Negative for agitation and behavioral problems. The patient is nervous/anxious.      Objective:    Physical Exam Vitals reviewed.  Constitutional:      General: He is not in acute distress.    Appearance: He is not diaphoretic.  HENT:     Head: Normocephalic and atraumatic.     Nose: Nose normal.     Mouth/Throat:     Mouth: Mucous membranes are moist.  Eyes:     General: No scleral icterus.    Extraocular Movements: Extraocular movements intact.  Cardiovascular:     Rate and Rhythm: Normal rate and regular rhythm.     Pulses: Normal pulses.     Heart sounds: Normal heart sounds. No murmur heard. Pulmonary:     Breath sounds: Wheezing (B/l diffuse) present. No rales.  Musculoskeletal:     Cervical back: Neck supple. No tenderness.     Right lower leg: No edema.     Left lower leg: No edema.  Skin:    General: Skin is warm.     Findings: No rash.  Neurological:     General: No focal deficit present.     Mental Status: He is alert and oriented to person, place, and time.     Sensory: No sensory deficit.     Motor: No weakness.  Psychiatric:        Mood and Affect: Mood normal.        Behavior: Behavior normal.    BP 138/88 (BP Location: Left Arm, Patient Position: Sitting, Cuff Size: Normal)    Pulse 69    Resp 18    Ht _0  (1.727 m)    Wt 208 lb (94.3 kg)    SpO2 95%    BMI 31.63  kg/m  Wt Readings from Last 3 Encounters:  01/31/22 208 lb (94.3 kg)  10/31/21 215 lb 1.3 oz (97.6 kg)  07/28/21 214 lb (97.1 kg)    Lab Results  Component Value Date   TSH 1.050 03/28/2021   Lab Results  Component Value Date   WBC 10.8 10/31/2021  HGB 15.6 10/31/2021   HCT 46.0 10/31/2021   MCV 92 10/31/2021   PLT 264 10/31/2021   Lab Results  Component Value Date   NA 139 10/31/2021   K 4.5 10/31/2021   CO2 29 10/31/2021   GLUCOSE 95 10/31/2021   BUN 8 10/31/2021   CREATININE 0.96 10/31/2021   BILITOT 0.4 10/31/2021   ALKPHOS 89 10/31/2021   AST 19 10/31/2021   ALT 8 10/31/2021   PROT 6.9 10/31/2021   ALBUMIN 4.2 10/31/2021   CALCIUM 9.4 10/31/2021   ANIONGAP 10 05/22/2017   EGFR 82 10/31/2021   Lab Results  Component Value Date   CHOL 147 03/28/2021   Lab Results  Component Value Date   HDL 32 (L) 03/28/2021   Lab Results  Component Value Date   LDLCALC 87 03/28/2021   Lab Results  Component Value Date   TRIG 162 (H) 03/28/2021   Lab Results  Component Value Date   CHOLHDL 4.6 03/28/2021   Lab Results  Component Value Date   HGBA1C 5.7 (H) 10/31/2021      Assessment & Plan:   Problem List Items Addressed This Visit       Cardiovascular and Mediastinum   Hypertension - Primary    BP Readings from Last 1 Encounters:  01/31/22 138/88  Well-controlled with Telmisartan and Coreg now Had added Coreg considering his risk for CAD Counseled for compliance with the medications Advised DASH diet and moderate exercise/walking, at least 150 mins/wee Needs to quit smoking and be compliant to medications  Refer to CCM for medication adherence counseling      Relevant Medications   atorvastatin (LIPITOR) 20 MG tablet   telmisartan (MICARDIS) 40 MG tablet     Respiratory   Chronic obstructive pulmonary disease (HCC)    Has wheezing and coughing spells Needs to quit smoking Started Albuterol inhaler PRN for now If persistent symptoms or  frequent use of Albuterol required, will add maintenance inhaler      Relevant Medications   albuterol (VENTOLIN HFA) 108 (90 Base) MCG/ACT inhaler     Other   Hyperlipidemia    Continue Atorvastatin Lipid profile reviewed      Relevant Medications   atorvastatin (LIPITOR) 20 MG tablet   telmisartan (MICARDIS) 40 MG tablet   Other Relevant Orders   Lipid Profile   Major depressive disorder, single episode, severe without psychotic features ([place])    Now better with Lexapro      Relevant Medications   escitalopram (LEXAPRO) 10 MG tablet   Tobacco abuse    Smokes about 1 pack/day  Asked about quitting: confirms that he currently smokes cigarettes Advise to quit smoking: Educated about QUITTING to reduce the risk of cancer, cardio and cerebrovascular disease. Assess willingness: Unwilling to quit at this time, but is working on cutting back. Assist with counseling and pharmacotherapy: Counseled for 5 minutes and literature provided. Nicotine patch prescribed. Arrange for follow up: follow up in 3 months and continue to offer help.      Anxiety   Relevant Medications   escitalopram (LEXAPRO) 10 MG tablet   Prostate cancer screening    Ordered PSA after discussing its limitations for prostate cancer screening, including false positive results leading additional investigations.      Relevant Orders   PSA   Other Visit Diagnoses     Special screening for malignant neoplasms, colon       Relevant Orders   Cologuard   Annual physical exam  Relevant Orders   TSH   Hemoglobin A1c   CMP14+EGFR   CBC with Differential/Platelet   Vitamin D deficiency       Relevant Orders   VITAMIN D 25 Hydroxy (Vit-D Deficiency, Fractures)       Meds ordered this encounter  Medications   albuterol (VENTOLIN HFA) 108 (90 Base) MCG/ACT inhaler    Sig: Inhale 2 puffs into the lungs every 6 (six) hours as needed for wheezing or shortness of breath.    Dispense:  8 g    Refill:  5     Okay to substitute to generic/formulary Albuterol.   atorvastatin (LIPITOR) 20 MG tablet    Sig: Take 1 tablet (20 mg total) by mouth daily.    Dispense:  90 tablet    Refill:  1   escitalopram (LEXAPRO) 10 MG tablet    Sig: Take 1 tablet (10 mg total) by mouth daily.    Dispense:  90 tablet    Refill:  1   telmisartan (MICARDIS) 40 MG tablet    Sig: Take 1 tablet (40 mg total) by mouth daily.    Dispense:  90 tablet    Refill:  1    Follow-up: Return in about 6 months (around 08/03/2022) for Annual physical.    Lindell Spar, MD

## 2022-01-31 NOTE — Assessment & Plan Note (Signed)
Has wheezing and coughing spells ?Needs to quit smoking ?Started Albuterol inhaler PRN for now ?If persistent symptoms or frequent use of Albuterol required, will add maintenance inhaler ?

## 2022-01-31 NOTE — Assessment & Plan Note (Signed)
Now better with Lexapro ?

## 2022-02-22 ENCOUNTER — Ambulatory Visit (INDEPENDENT_AMBULATORY_CARE_PROVIDER_SITE_OTHER): Payer: Medicare HMO | Admitting: Pharmacist

## 2022-02-22 VITALS — BP 176/11 | HR 52 | Ht 68.0 in | Wt 211.0 lb

## 2022-02-22 DIAGNOSIS — F322 Major depressive disorder, single episode, severe without psychotic features: Secondary | ICD-10-CM

## 2022-02-22 DIAGNOSIS — E782 Mixed hyperlipidemia: Secondary | ICD-10-CM

## 2022-02-22 DIAGNOSIS — I10 Essential (primary) hypertension: Secondary | ICD-10-CM

## 2022-02-22 DIAGNOSIS — F419 Anxiety disorder, unspecified: Secondary | ICD-10-CM

## 2022-02-22 DIAGNOSIS — Z720 Tobacco use: Secondary | ICD-10-CM

## 2022-02-22 NOTE — Chronic Care Management (AMB) (Signed)
? ? ?Chronic Care Management ?Pharmacy Note ? ?02/22/2022 ?Name:  Brett Elliott MRN:  [id number] DOB:  [birth year] ? ?Summary: ?Hypertension ?Blood pressure control is unclear. High today (without chest pain, blurry vision, or headache), but at last office visit with primary care provider, it was approaching goal. Patient reports he had a cigarette within 30 minutes prior to our visit which may have increased blood pressure. Patient does not monitor blood pressure outside of office. Blood pressure is above goal of <130/80 mmHg per 2017 AHA/ACC guidelines. ?Taking medications as directed: yes patient reports strict adherence since last visit with me ?Current home blood pressure: patient does not have a blood pressure machine at this time but is open to getting one. Patient was instructed to get Omrom blood pressure machine at [facility] for ~$35 and check blood pressure at least once daily.  ?Continued to recommend blood pressure medication adherence ?Continue carvedilol 6.25 mg by mouth twice daily and telmisartan 40 mg by mouth once daily ?Since blood pressure control unclear in office and patient is not monitoring at home, will have him follow-up in office next week with primary care provider to recheck blood pressure ?Encourage dietary sodium restriction/DASH diet. Patient currently has a diet high in sodium.  ?Reviewed risks of hypertension, principles of treatment and consequences of untreated hypertension ? ?Hyperlipidemia ?Uncontrolled due to poor compliance with prescribed regimen. LDL above goal of <70 due to very high risk given 10-year risk >20% per 2020 AACE/ACE guidelines. Triglycerides above goal of <150 per 2020 AACE/ACE guidelines. ?Current medications: atorvastatin 20 mg by mouth once daily ?Taking medications as directed: no, patient reports he is does not like taking atorvastatin because of how it makes him feel. ?Reviewed risks of hyperlipidemia, principles of treatment and consequences of untreated  hyperlipidemia ?Discussed need for medication compliance ?Patient agreeable to improving adherence of atorvastatin 20 mg by mouth daily. He plans to take at lunch time to separate from all other medications. Given extremely high ASCVD risk, may be reasonable to increase to high intensity statin. ? ?Tobacco Abuse (Current Smoker) ?Patient endorses that he currently smokes ~20 cigarettes per day which is same as last visit ?Advised patient to quit smoking and offered support. Patient declines Chantix due to fear of adverse effects.  ?Patient open to discussing [facility] [place]. He does not answer calls from unknown numbers so he was given their number to call when he is ready. They can provide him with free samples of combination nicotine replacement therapy.  ? ?Subjective: ?Brett Elliott is an 76 y.o. year old male who is a primary patient of Lindell Spar, MD.  The CCM team was consulted for assistance with disease management and care coordination needs.   ? ?Engaged with patient face to face for follow up visit in response to provider referral for pharmacy case management and/or care coordination services.  ? ?Consent to Services:  ?The patient was given information about Chronic Care Management services, agreed to services, and gave verbal consent prior to initiation of services.  Please see initial visit note for detailed documentation.  ? ?Patient Care Team: ?Lindell Spar, MD as PCP - General (Internal Medicine) ?Eloise Harman, DO as Consulting Physician (Gastroenterology) ?Beryle Lathe, [facility] (Pharmacist) ? ?Objective: ? ?Lab Results  ?Component Value Date  ? CREATININE 0.96 10/31/2021  ? CREATININE 0.94 03/28/2021  ? CREATININE 0.80 05/21/2019  ? ? ?Lab Results  ?Component Value Date  ? HGBA1C 5.7 (H) 10/31/2021  ? ?Last diabetic Eye  exam: No results found for: HMDIABEYEEXA  ?Last diabetic Foot exam: No results found for: HMDIABFOOTEX  ? ?   ?Component Value Date/Time  ? CHOL 147 03/28/2021 1419   ? TRIG 162 (H) 03/28/2021 1419  ? HDL 32 (L) 03/28/2021 1419  ? CHOLHDL 4.6 03/28/2021 1419  ? [place] 87 03/28/2021 1419  ? ? ? ?  Latest Ref Rng & Units 10/31/2021  ?  4:32 PM 03/28/2021  ?  2:19 PM 05/22/2017  ?  3:36 AM  ?Hepatic Function  ?Total Protein 6.0 - 8.5 g/dL 6.9   6.7   7.0    ?Albumin 3.7 - 4.7 g/dL 4.2   4.2   4.0    ?AST 0 - 40 IU/L 19   23   32    ?ALT 0 - 44 IU/L '8   12   18    ' ?Alk Phosphatase 44 - 121 IU/L 89   95   84    ?Total Bilirubin 0.0 - 1.2 mg/dL 0.4   0.3   0.5    ? ? ?Lab Results  ?Component Value Date/Time  ? TSH 1.050 03/28/2021 02:19 PM  ? FREET4 1.12 03/28/2021 02:19 PM  ? ? ? ?  Latest Ref Rng & Units 10/31/2021  ?  4:32 PM 03/28/2021  ?  2:19 PM 05/22/2017  ?  3:36 AM  ?CBC  ?WBC 3.4 - 10.8 x10E3/uL 10.8   6.6   10.5    ?Hemoglobin 13.0 - 17.7 g/dL 15.6   15.7   13.4    ?Hematocrit 37.5 - 51.0 % 46.0   47.7   39.1    ?Platelets 150 - 450 x10E3/uL 264   205   275    ? ? ?Lab Results  ?Component Value Date/Time  ? VD25OH 80.4 10/31/2021 04:32 PM  ? VD25OH 47.9 03/28/2021 02:19 PM  ? ? ?Clinical ASCVD: No  ?The 10-year ASCVD risk score (Arnett DK, et al., 2019) is: 49.2% ?  Values used to calculate the score: ?    Age: 17 years ?    Sex: Male ?    Is Non-Hispanic African American: No ?    Diabetic: No ?    Tobacco smoker: Yes ?    Systolic Blood Pressure: 177 mmHg ?    Is BP treated: Yes ?    HDL Cholesterol: 32 mg/dL ?    Total Cholesterol: 147 mg/dL   ? ?Social History  ? ?Tobacco Use  ?Smoking Status Every Day  ? Packs/day: 1.00  ? Types: Cigarettes  ?Smokeless Tobacco Never  ? ?BP Readings from Last 3 Encounters:  ?02/22/22 (!) 176/11  ?01/31/22 138/88  ?12/21/21 (!) 180/94  ? ?Pulse Readings from Last 3 Encounters:  ?02/22/22 (!) 52  ?01/31/22 69  ?12/21/21 63  ? ?Wt Readings from Last 3 Encounters:  ?02/22/22 211 lb (95.7 kg)  ?01/31/22 208 lb (94.3 kg)  ?10/31/21 215 lb 1.3 oz (97.6 kg)  ? ? ?Assessment: Review of patient past medical history, allergies, medications, health status,  including review of consultants reports, laboratory and other test data, was performed as part of comprehensive evaluation and provision of chronic care management services.  ? ?SDOH:  (Social Determinants of Health) assessments and interventions performed:  ? ? ?CCM Care Plan ? ?Allergies  ?Allergen Reactions  ? Cephalexin Hives and Rash  ? ? ?Medications Reviewed Today   ? ? Reviewed by Beryle Lathe, [facility] (Pharmacist) on 02/22/22 at 1432  Med List Status: <None>  ? ?  Medication Order Taking? Sig Documenting Provider Last Dose Status Informant  ?albuterol (VENTOLIN HFA) 108 (90 Base) MCG/ACT inhaler [id number] Yes Inhale 2 puffs into the lungs every 6 (six) hours as needed for wheezing or shortness of breath. Lindell Spar, MD Taking Active   ?aspirin EC 325 MG tablet [id number] Yes Take 325 mg by mouth daily as needed for mild pain or moderate pain. [provider] Taking Active Spouse/Significant Other  ?atorvastatin (LIPITOR) 20 MG tablet [id number] No Take 1 tablet (20 mg total) by mouth daily.  ?Patient not taking: Reported on 02/22/2022  ? Lindell Spar, MD Not Taking Active   ?carvedilol (COREG) 6.25 MG tablet [id number] Yes Take 1 tablet (6.25 mg total) by mouth 2 (two) times daily with a meal. Lindell Spar, MD Taking Active   ?         ?Med Note Vernell Barrier Dec 21, 2021  2:26 PM) Takes sometimes  ?escitalopram (LEXAPRO) 10 MG tablet [id number] Yes Take 1 tablet (10 mg total) by mouth daily. Lindell Spar, MD Taking Active   ?telmisartan (MICARDIS) 40 MG tablet [id number] Yes Take 1 tablet (40 mg total) by mouth daily. Lindell Spar, MD Taking Active   ? ?  ?  ? ?  ? ? ?Patient Active Problem List  ? Diagnosis Date Noted  ? Chronic obstructive pulmonary disease ([place]) 01/31/2022  ? Prostate cancer screening 10/31/2021  ? Chronic nonintractable headache 07/29/2021  ? Angina pectoris ([place]) 07/29/2021  ? History of cerebral aneurysm repair 07/29/2021  ? Genital warts  03/10/2021  ? Anxiety 03/10/2021  ? OA (osteoarthritis) 03/10/2021  ? Hyperlipidemia 05/29/2017  ? Hypertension 05/29/2017  ? Tobacco abuse 05/29/2017  ? Major depressive disorder, single episode, severe

## 2022-02-22 NOTE — Patient Instructions (Signed)
Brett Elliott, ? ?It was great to talk to you today! ? ?Your blood pressure goal is to ideally be <130/80 mmHg. If your blood pressure remains >150/90 mmHg at home please call our office for sooner follow-up with your primary care provider. Start checking your blood pressure at home at least once daily.  ? ?Please call me with any questions or concerns. ? ?Visit Information ? ?Following are the goals we discussed today:  ? Goals Addressed   ? ?  ?  ?  ?  ? This Visit's Progress  ?  Medication Management     ?  Patient Goals/Self-Care Activities ?Patient will:  ?Focus on medication adherence by keeping up with prescription refills and either using a pill box or reminders to take your medications at the prescribed times ?Check blood pressure at least once daily, document, and provide at future appointments ? ?  ? ?  ?  ? ?Follow-up plan: Next PCP appointment scheduled for: 08/03/22 ? ?Print copy of patient instructions, educational materials, and care plan provided in person.  ? ?Please call the care guide team at [phone] if you need to cancel or reschedule your appointment.  ? ?Domenic Moras, PharmD, BCACP, CPP ?Clinical Pharmacist Practitioner ?[facility] ?[phone]  ? ? ? ?  ?

## 2022-02-24 DIAGNOSIS — F322 Major depressive disorder, single episode, severe without psychotic features: Secondary | ICD-10-CM

## 2022-02-24 DIAGNOSIS — I10 Essential (primary) hypertension: Secondary | ICD-10-CM

## 2022-02-24 DIAGNOSIS — E782 Mixed hyperlipidemia: Secondary | ICD-10-CM

## 2022-02-28 ENCOUNTER — Ambulatory Visit: Payer: Medicare HMO | Admitting: Internal Medicine

## 2022-03-16 DIAGNOSIS — Z1211 Encounter for screening for malignant neoplasm of colon: Secondary | ICD-10-CM | POA: Diagnosis not present

## 2022-03-25 LAB — COLOGUARD: COLOGUARD: NEGATIVE

## 2022-03-27 ENCOUNTER — Telehealth: Payer: Self-pay | Admitting: Internal Medicine

## 2022-03-27 NOTE — Telephone Encounter (Signed)
Patient return call for test results.  

## 2022-03-27 NOTE — Telephone Encounter (Signed)
Pt advised with verbal understanding  °

## 2022-04-07 ENCOUNTER — Encounter: Payer: Self-pay | Admitting: Family Medicine

## 2022-04-07 ENCOUNTER — Ambulatory Visit (INDEPENDENT_AMBULATORY_CARE_PROVIDER_SITE_OTHER): Payer: Medicare HMO | Admitting: Family Medicine

## 2022-04-07 VITALS — BP 154/80 | HR 62 | Resp 16 | Ht 68.0 in | Wt 210.1 lb

## 2022-04-07 DIAGNOSIS — I10 Essential (primary) hypertension: Secondary | ICD-10-CM | POA: Diagnosis not present

## 2022-04-07 DIAGNOSIS — Z720 Tobacco use: Secondary | ICD-10-CM | POA: Diagnosis not present

## 2022-04-07 DIAGNOSIS — F322 Major depressive disorder, single episode, severe without psychotic features: Secondary | ICD-10-CM

## 2022-04-07 MED ORDER — AMLODIPINE BESYLATE 2.5 MG PO TABS: 2.5000 mg | ORAL_TABLET | Freq: Every day | ORAL | 0 refills | Status: DC

## 2022-04-07 NOTE — Patient Instructions (Signed)
Please schedule follow up in the office with Dr Allena Katz next week if possible or in next 2 weeks ? ?New additional medication n for blood pressure is amlodipine 2.5 mg one daily in the evening ? ? ? ?Blood pressure today is lower than you have been getting at home, but is still too high ? ?Thanks for choosing [facility], we consider it a privelige to serve you. ? ? ? ? ? ?

## 2022-04-09 ENCOUNTER — Encounter: Payer: Self-pay | Admitting: Family Medicine

## 2022-04-09 NOTE — Progress Notes (Signed)
? ?  Brett Elliott     MRN: [id number]      DOB: Oct 04, 1946 ? ? ?HPI ?Brett Elliott is here  twith his wife who is very concerned that his blood pressure is too high, and this is contributing to him not feeling well, being sluggish and not wanting to do anything. She has been checking bloodpressure and logging daily, and the record ahe has shows systolic pressure often over 170 and as high as the 190'6 in reent times ?Brett Elliott say  he wants his pressure normal. He denies chest pain, palpitaion, PND or orthopnea, he denies leg swelling. ?ROS ?Denies recent fever or chills. ?Denies sinus pressure, nasal congestion, ear pain or sore throat. ?Denies chest congestion, productive cough or wheezing. ?Denies chest pains, palpitations and leg swelling ? ? ?PE ? ?BP (!) 154/80   Pulse 62   Resp 16   Ht 5\' 8"  (1.727 m)   Wt 210 lb 1.9 oz (95.3 kg)   SpO2 92%   BMI 31.95 kg/m?  ? ?Patient alert and oriented and in no cardiopulmonary distress. ? ?HEENT: No facial asymmetry, EOMI,     Neck supple . ? ?Chest: Clear to auscultation bilaterally., decreased air entry hroiughout ? ?CVS: S1, S2 no murmurs, no S3.Regular rate. ? ?  ? ?Ext: No edema ? ?MS: Adequate ROM spine, shoulders, hips and knees. ? ?Psych: flat affect, poor eye contact, spouse communicated during most of the visit ?. ? ?CNS: CN 2-12 intact, power,  no focal deficits noted. ? ? ?Assessment & Plan ? ?Hypertension ?Uncontrolled and not at goal, but lower than pt record ?Amlodipine 2.5 mg added, he is to continue the medications he has been taking  ?F/U with Primary and encouraged o bring his cuff , log and medications to that visit also ? ?Tobacco abuse ?Encouraged to cut back and quit  ? ?

## 2022-04-09 NOTE — Assessment & Plan Note (Signed)
F/u with PCP.  

## 2022-04-09 NOTE — Assessment & Plan Note (Signed)
Encouraged to cut back and quit  ?

## 2022-04-09 NOTE — Assessment & Plan Note (Signed)
Uncontrolled and not at goal, but lower than pt record ?Amlodipine 2.5 mg added, he is to continue the medications he has been taking  ?F/U with Primary and encouraged o bring his cuff , log and medications to that visit also ?

## 2022-04-17 ENCOUNTER — Ambulatory Visit
Admission: EM | Admit: 2022-04-17 | Discharge: 2022-04-17 | Disposition: A | Discharge status: Home / Self Care | Payer: Medicare HMO | Attending: Nurse Practitioner | Admitting: Nurse Practitioner

## 2022-04-17 ENCOUNTER — Encounter: Payer: Self-pay | Admitting: Emergency Medicine

## 2022-04-17 DIAGNOSIS — W57XXXA Bitten or stung by nonvenomous insect and other nonvenomous arthropods, initial encounter: Secondary | ICD-10-CM

## 2022-04-17 DIAGNOSIS — S40862A Insect bite (nonvenomous) of left upper arm, initial encounter: Secondary | ICD-10-CM

## 2022-04-17 MED ORDER — DOXYCYCLINE HYCLATE 100 MG PO CAPS: 200.0000 mg | ORAL_CAPSULE | Freq: Once | ORAL | 0 refills | Status: AC

## 2022-04-17 NOTE — ED Provider Notes (Signed)
[facility]    CSN: [id number] Arrival date & time: 04/17/22  1521      History   Chief Complaint No chief complaint on file.   HPI DEJOUR VOS is a 76 y.o. male.   The patient is a 76 year old male who presents with his spouse for complaints of tick bite.  Patient's spouse states the tick was found and removed approximately 5 to 10 minutes after exposure.  She states the patient now has an area to his left upper arm near the armpit.  The area is red and has some swelling.  The patient denies fever, chills, generalized fatigue or malaise, itching, drainage.  The patient's spouse states that she cleaned the area with alcohol and use Neosporin to the site.  Patient's spouse states patient has a history of getting tick bites, and in the past he has been prescribed antibiotics by his PCP.  The history is provided by the patient and the spouse.   Past Medical History:  Diagnosis Date   Anxiety    Brain aneurysm    Hypertension     Patient Active Problem List   Diagnosis Date Noted   Chronic obstructive pulmonary disease (HCC) 01/31/2022   Prostate cancer screening 10/31/2021   Chronic nonintractable headache 07/29/2021   Angina pectoris (HCC) 07/29/2021   History of cerebral aneurysm repair 07/29/2021   Genital warts 03/10/2021   Anxiety 03/10/2021   OA (osteoarthritis) 03/10/2021   Hyperlipidemia 05/29/2017   Hypertension 05/29/2017   Tobacco abuse 05/29/2017   Major depressive disorder, single episode, severe without psychotic features (HCC) 05/22/2017    Past Surgical History:  Procedure Laterality Date   BRAIN SURGERY     CATARACT EXTRACTION W/ INTRAOCULAR LENS IMPLANT Bilateral    IR RADIOLOGIST EVAL & MGMT  07/03/2017       Home Medications    Prior to Admission medications   Medication Sig Start Date End Date Taking? Authorizing Provider  doxycycline (VIBRAMYCIN) 100 MG capsule Take 2 capsules (200 mg total) by mouth once for 1 dose. 04/17/22  04/17/22 Yes Dominyk Law-Warren, Sadie Haber, NP  albuterol (VENTOLIN HFA) 108 (90 Base) MCG/ACT inhaler Inhale 2 puffs into the lungs every 6 (six) hours as needed for wheezing or shortness of breath. 01/31/22   Anabel Halon, MD  amLODipine (NORVASC) 2.5 MG tablet Take 1 tablet (2.5 mg total) by mouth daily. 04/07/22   Kerri Perches, MD  aspirin EC 325 MG tablet Take 325 mg by mouth daily as needed for mild pain or moderate pain.    [provider]  atorvastatin (LIPITOR) 20 MG tablet Take 1 tablet (20 mg total) by mouth daily. 01/31/22   Anabel Halon, MD  carvedilol (COREG) 6.25 MG tablet Take 1 tablet (6.25 mg total) by mouth 2 (two) times daily with a meal. 04/09/22   Kerri Perches, MD  escitalopram (LEXAPRO) 10 MG tablet Take 1 tablet (10 mg total) by mouth daily. 01/31/22   Anabel Halon, MD  telmisartan (MICARDIS) 40 MG tablet Take 1 tablet (40 mg total) by mouth daily. 01/31/22   Anabel Halon, MD    Family History History reviewed. No pertinent family history.  Social History Social History   Tobacco Use   Smoking status: Every Day    Packs/day: 1.00    Types: Cigarettes   Smokeless tobacco: Never  Substance Use Topics   Alcohol use: No   Drug use: No     Allergies   Cephalexin  Review of Systems Review of Systems PER HPI  Physical Exam Triage Vital Signs ED Triage Vitals  Enc Vitals Group     BP 04/17/22 1602 (!) 158/79     Pulse Rate 04/17/22 1602 (!) 58     Resp 04/17/22 1602 18     Temp 04/17/22 1602 97.9 F (36.6 C)     Temp Source 04/17/22 1602 Oral     SpO2 04/17/22 1602 95 %     Weight --      Height --      Head Circumference --      Peak Flow --      Pain Score 04/17/22 1603 3     Pain Loc --      Pain Edu? --      Excl. in GC? --    No data found.  Updated Vital Signs BP (!) 158/79 (BP Location: Right Arm)   Pulse (!) 58   Temp 97.9 F (36.6 C) (Oral)   Resp 18   SpO2 95%   Visual Acuity Right Eye Distance:   Left  Eye Distance:   Bilateral Distance:    Right Eye Near:   Left Eye Near:    Bilateral Near:     Physical Exam Vitals and nursing note reviewed.  Eyes:     Extraocular Movements: Extraocular movements intact.     Conjunctiva/sclera: Conjunctivae normal.     Pupils: Pupils are equal, round, and reactive to light.  Cardiovascular:     Rate and Rhythm: Normal rate and regular rhythm.     Pulses: Normal pulses.     Heart sounds: Normal heart sounds.  Pulmonary:     Effort: Pulmonary effort is normal. No respiratory distress.     Breath sounds: Normal breath sounds. No wheezing or rales.  Abdominal:     General: Bowel sounds are normal.     Palpations: Abdomen is soft.  Musculoskeletal:     Cervical back: Normal range of motion.  Skin:    General: Skin is warm and dry.     Capillary Refill: Capillary refill takes less than 2 seconds.     Findings: Rash present. No abscess. Rash is urticarial.     Comments: Induration to the medial aspect of the left upper arm immediately under the axilla.  Area is red, slightly raised.  No warmth or drainage noted.  Neurological:     General: No focal deficit present.     Mental Status: He is oriented to person, place, and time.  Psychiatric:        Mood and Affect: Mood normal.        Behavior: Behavior normal.     UC Treatments / Results  Labs (all labs ordered are listed, but only abnormal results are displayed) Labs Reviewed - No data to display  EKG   Radiology No results found.  Procedures Procedures (including critical care time)  Medications Ordered in UC Medications - No data to display  Initial Impression / Assessment and Plan / UC Course  I have reviewed the triage vital signs and the nursing notes.  Pertinent labs & imaging results that were available during my care of the patient were reviewed by me and considered in my medical decision making (see chart for details).  Patient is a 76 year old male who presents after  a tick bite to the left arm.  Patient's family states tick was present for about 5 to 10 minutes before it was removed.  Patient presents  with an induration to the left upper arm.  Patient's family is concerned because he has had tick bites in the past and symptoms have worsened rapidly without antibiotics.  His exam is reassuring, and his vital signs are stable.  We will provide the patient with a prescription for doxycycline 200 mg one-time dose.  Discussion with the patient's family member regarding erythema migrans, was able to pull up the pictures on her cell phone to show her what to look for for the next 1 to 2 months.  Strict return precautions were provided of when to return to our clinic versus when to go to the ER.  Follow-up as needed. Final Clinical Impressions(s) / UC Diagnoses   Final diagnoses:  Tick bite of left upper arm, initial encounter     Discharge Instructions      Take medication as prescribed. As discussed, continue to monitor the area for a bull's-eye appearing rash.  You should continue to monitor the area for the next 1 to 2 months. You may continue to apply Neosporin to the area as needed. Follow-up in the ED immediately if he develops fever, chills, generalized fatigue, or other concerns. Follow-up as needed.     ED Prescriptions     Medication Sig Dispense Auth. Provider   doxycycline (VIBRAMYCIN) 100 MG capsule Take 2 capsules (200 mg total) by mouth once for 1 dose. 2 capsule Leiya Keesey-Warren, Sadie Haberhristie J, NP      PDMP not reviewed this encounter.   Abran CantorLeath-Warren, Rashay Barnette J, NP 04/17/22 1627

## 2022-04-17 NOTE — ED Triage Notes (Signed)
Pulled a tick off of left upper arm on Friday.  Red swollen area close to left axilla

## 2022-04-17 NOTE — Discharge Instructions (Signed)
Take medication as prescribed. As discussed, continue to monitor the area for a bull's-eye appearing rash.  You should continue to monitor the area for the next 1 to 2 months. You may continue to apply Neosporin to the area as needed. Follow-up in the ED immediately if he develops fever, chills, generalized fatigue, or other concerns. Follow-up as needed. 

## 2022-04-27 ENCOUNTER — Ambulatory Visit (INDEPENDENT_AMBULATORY_CARE_PROVIDER_SITE_OTHER): Payer: Medicare HMO | Admitting: Internal Medicine

## 2022-04-27 ENCOUNTER — Encounter: Payer: Self-pay | Admitting: Internal Medicine

## 2022-04-27 VITALS — BP 136/82 | HR 58 | Resp 18 | Ht 68.0 in | Wt 210.2 lb

## 2022-04-27 DIAGNOSIS — Z720 Tobacco use: Secondary | ICD-10-CM

## 2022-04-27 DIAGNOSIS — I10 Essential (primary) hypertension: Secondary | ICD-10-CM

## 2022-04-27 DIAGNOSIS — F419 Anxiety disorder, unspecified: Secondary | ICD-10-CM

## 2022-04-27 MED ORDER — MISC. DEVICES MISC: 0 refills | Status: DC

## 2022-04-27 MED ORDER — MISC. DEVICES MISC: 0 refills | Status: AC

## 2022-04-27 NOTE — Patient Instructions (Signed)
Please continue to take medications as prescribed. ? ?Please continue to follow low salt diet and ambulate as tolerated. ?

## 2022-04-27 NOTE — Progress Notes (Signed)
Acute Office Visit  Subjective:    Patient ID: Brett Elliott, male    DOB: [birth year], 76 y.o.   MRN: [id number]  Chief Complaint  Patient presents with   Follow-up    2 week follow up per dr simpson regarding high blood pressure     HPI Patient is in today for f/u of HTN.  His BP was significantly elevated at home, for which he was seen by Dr. Moshe Cipro about 2 weeks ago.  He reported that BP was running at 170s/90s at times.  Amlodipine was added to his regimen.  He has brought his home BP readings today, which still show elevated BP readings in 170s/80s most of the time.  His BP was 158/78 on his device today. I checked BP manually, which was 132/70 on left arm and 136/70 on right arm.  He has occasional headache, which he attributes to anxiety.  He denies any chest pain or palpitations currently.   Past Medical History:  Diagnosis Date   Anxiety    Brain aneurysm    Hypertension     Past Surgical History:  Procedure Laterality Date   BRAIN SURGERY     CATARACT EXTRACTION W/ INTRAOCULAR LENS IMPLANT Bilateral    IR RADIOLOGIST EVAL & MGMT  07/03/2017    History reviewed. No pertinent family history.  Social History   Socioeconomic History   Marital status: Married    Spouse name: Not on file   Number of children: Not on file   Years of education: Not on file   Highest education level: Not on file  Occupational History   Not on file  Tobacco Use   Smoking status: Every Day    Packs/day: 1.00    Types: Cigarettes   Smokeless tobacco: Never  Substance and Sexual Activity   Alcohol use: No   Drug use: No   Sexual activity: Not on file  Other Topics Concern   Not on file  Social History Narrative   Not on file   Social Determinants of Health   Financial Resource Strain: Medium Risk   Difficulty of Paying Living Expenses: Somewhat hard  Food Insecurity: No Food Insecurity   Worried About Running Out of Food in the Last Year: Never true   Ran Out of Food in  the Last Year: Never true  Transportation Needs: No Transportation Needs   Lack of Transportation (Medical): No   Lack of Transportation (Non-Medical): No  Physical Activity: Inactive   Days of Exercise per Week: 0 days   Minutes of Exercise per Session: 0 min  Stress: No Stress Concern Present   Feeling of Stress : Only a little  Social Connections: Moderately Isolated   Frequency of Communication with Friends and Family: More than three times a week   Frequency of Social Gatherings with Friends and Family: More than three times a week   Attends Religious Services: Never   Marine scientist or Organizations: No   Attends Music therapist: Never   Marital Status: Married  Human resources officer Violence: Not At Risk   Fear of Current or Ex-Partner: No   Emotionally Abused: No   Physically Abused: No   Sexually Abused: No    Outpatient Medications Prior to Visit  Medication Sig Dispense Refill   albuterol (VENTOLIN HFA) 108 (90 Base) MCG/ACT inhaler Inhale 2 puffs into the lungs every 6 (six) hours as needed for wheezing or shortness of breath. 8 g 5   amLODipine (NORVASC)  2.5 MG tablet Take 1 tablet (2.5 mg total) by mouth daily. 30 tablet 0   aspirin EC 325 MG tablet Take 325 mg by mouth daily as needed for mild pain or moderate pain.     atorvastatin (LIPITOR) 20 MG tablet Take 1 tablet (20 mg total) by mouth daily. 90 tablet 1   carvedilol (COREG) 6.25 MG tablet Take 1 tablet (6.25 mg total) by mouth 2 (two) times daily with a meal. 60 tablet 0   escitalopram (LEXAPRO) 10 MG tablet Take 1 tablet (10 mg total) by mouth daily. 90 tablet 1   telmisartan (MICARDIS) 40 MG tablet Take 1 tablet (40 mg total) by mouth daily. 90 tablet 1   No facility-administered medications prior to visit.    Allergies  Allergen Reactions   Cephalexin Hives and Rash    Review of Systems  Constitutional:  Negative for chills and fever.  HENT:  Negative for congestion and sore throat.    Eyes:  Negative for pain and discharge.  Respiratory:  Positive for cough. Negative for shortness of breath.   Cardiovascular:  Negative for chest pain and palpitations.  Gastrointestinal:  Negative for constipation, diarrhea, nausea and vomiting.  Endocrine: Negative for polydipsia and polyuria.  Genitourinary:  Negative for dysuria and hematuria.  Musculoskeletal:  Negative for neck pain and neck stiffness.  Skin:  Negative for rash.  Neurological:  Negative for dizziness, weakness and numbness.  Psychiatric/Behavioral:  Positive for sleep disturbance. Negative for agitation and behavioral problems. The patient is nervous/anxious.       Objective:    Physical Exam Vitals reviewed.  Constitutional:      General: He is not in acute distress.    Appearance: He is not diaphoretic.  HENT:     Head: Normocephalic and atraumatic.     Nose: Nose normal.     Mouth/Throat:     Mouth: Mucous membranes are moist.  Eyes:     General: No scleral icterus.    Extraocular Movements: Extraocular movements intact.  Cardiovascular:     Rate and Rhythm: Normal rate and regular rhythm.     Pulses: Normal pulses.     Heart sounds: Normal heart sounds. No murmur heard. Pulmonary:     Breath sounds: No wheezing or rales.  Musculoskeletal:     Cervical back: Neck supple. No tenderness.     Right lower leg: No edema.     Left lower leg: No edema.  Skin:    General: Skin is warm.     Findings: No rash.  Neurological:     General: No focal deficit present.     Mental Status: He is alert and oriented to person, place, and time.     Sensory: No sensory deficit.     Motor: No weakness.  Psychiatric:        Mood and Affect: Mood normal.        Behavior: Behavior normal.    BP 136/82 (BP Location: Left Arm, Patient Position: Sitting, Cuff Size: Normal)   Pulse (!) 58   Resp 18   Ht 5\' 8"  (1.727 m)   Wt 210 lb 3.2 oz (95.3 kg)   SpO2 96%   BMI 31.96 kg/m  Wt Readings from Last 3  Encounters:  04/27/22 210 lb 3.2 oz (95.3 kg)  04/07/22 210 lb 1.9 oz (95.3 kg)  02/22/22 211 lb (95.7 kg)        Assessment & Plan:   Problem List Items Addressed This Visit  Cardiovascular and Mediastinum   Hypertension - Primary    BP Readings from Last 1 Encounters:  04/27/22 136/82  Well-controlled with Telmisartan, amlodipine and Coreg now Had added Coreg considering his risk for CAD Counseled for compliance with the medications Advised DASH diet and moderate exercise/walking, at least 150 mins/wee Needs to quit smoking and be compliant to medications  His current BP device seems to be inaccurate.  Prescription for blood pressure cuff provided.      Relevant Medications   Misc. Devices MISC     Other   Tobacco abuse    Smokes about 1 pack/day  Asked about quitting: confirms that he currently smokes cigarettes Advise to quit smoking: Educated about QUITTING to reduce the risk of cancer, cardio and cerebrovascular disease. Assess willingness: Unwilling to quit at this time, but is working on cutting back. Assist with counseling and pharmacotherapy: Counseled for 5 minutes and literature provided. Nicotine patch prescribed. Arrange for follow up: follow up in 3 months and continue to offer help.      Anxiety    Uncontrolled, needs to take Lexapro regularly         Meds ordered this encounter  Medications   DISCONTD: Misc. Devices MISC    Sig: Blood pressure cuff - 1.    Dispense:  1 each    Refill:  0   Misc. Devices MISC    Sig: Blood pressure cuff - 1.    Dispense:  1 each    Refill:  0     Vinie Charity Keith Rake, MD

## 2022-04-27 NOTE — Assessment & Plan Note (Signed)
Smokes about 1 pack/day  Asked about quitting: confirms that he currently smokes cigarettes Advise to quit smoking: Educated about QUITTING to reduce the risk of cancer, cardio and cerebrovascular disease. Assess willingness: Unwilling to quit at this time, but is working on cutting back. Assist with counseling and pharmacotherapy: Counseled for 5 minutes and literature provided. Nicotine patch prescribed. Arrange for follow up: follow up in 3 months and continue to offer help.

## 2022-04-27 NOTE — Assessment & Plan Note (Signed)
Uncontrolled, needs to take Lexapro regularly

## 2022-04-27 NOTE — Assessment & Plan Note (Signed)
BP Readings from Last 1 Encounters:  04/27/22 136/82   Well-controlled with Telmisartan, amlodipine and Coreg now Had added Coreg considering his risk for CAD Counseled for compliance with the medications Advised DASH diet and moderate exercise/walking, at least 150 mins/wee Needs to quit smoking and be compliant to medications  His current BP device seems to be inaccurate.  Prescription for blood pressure cuff provided.

## 2022-06-08 ENCOUNTER — Other Ambulatory Visit: Payer: Self-pay | Admitting: Internal Medicine

## 2022-06-22 ENCOUNTER — Other Ambulatory Visit: Payer: Self-pay

## 2022-06-22 MED ORDER — AMLODIPINE BESYLATE 2.5 MG PO TABS: 2.5000 mg | ORAL_TABLET | Freq: Every day | ORAL | 2 refills | Status: DC

## 2022-07-07 ENCOUNTER — Other Ambulatory Visit: Payer: Self-pay | Admitting: Internal Medicine

## 2022-08-03 ENCOUNTER — Encounter: Payer: Self-pay | Admitting: Internal Medicine

## 2022-08-03 ENCOUNTER — Ambulatory Visit (INDEPENDENT_AMBULATORY_CARE_PROVIDER_SITE_OTHER): Payer: Medicare HMO | Admitting: Internal Medicine

## 2022-08-03 VITALS — BP 136/82 | HR 48 | Resp 18 | Ht 68.0 in | Wt 208.6 lb

## 2022-08-03 DIAGNOSIS — J449 Chronic obstructive pulmonary disease, unspecified: Secondary | ICD-10-CM | POA: Diagnosis not present

## 2022-08-03 DIAGNOSIS — E559 Vitamin D deficiency, unspecified: Secondary | ICD-10-CM

## 2022-08-03 DIAGNOSIS — Z0001 Encounter for general adult medical examination with abnormal findings: Secondary | ICD-10-CM | POA: Diagnosis not present

## 2022-08-03 DIAGNOSIS — F419 Anxiety disorder, unspecified: Secondary | ICD-10-CM | POA: Diagnosis not present

## 2022-08-03 DIAGNOSIS — Z23 Encounter for immunization: Secondary | ICD-10-CM | POA: Diagnosis not present

## 2022-08-03 DIAGNOSIS — E782 Mixed hyperlipidemia: Secondary | ICD-10-CM

## 2022-08-03 DIAGNOSIS — I10 Essential (primary) hypertension: Secondary | ICD-10-CM | POA: Diagnosis not present

## 2022-08-03 DIAGNOSIS — R7303 Prediabetes: Secondary | ICD-10-CM | POA: Diagnosis not present

## 2022-08-03 DIAGNOSIS — Z125 Encounter for screening for malignant neoplasm of prostate: Secondary | ICD-10-CM

## 2022-08-03 MED ORDER — CARVEDILOL 6.25 MG PO TABS: 6.2500 mg | ORAL_TABLET | Freq: Two times a day (BID) | ORAL | 1 refills | Status: DC

## 2022-08-03 MED ORDER — AMLODIPINE BESYLATE 2.5 MG PO TABS: 2.5000 mg | ORAL_TABLET | Freq: Every day | ORAL | 1 refills | Status: DC

## 2022-08-03 MED ORDER — TELMISARTAN 40 MG PO TABS: 40.0000 mg | ORAL_TABLET | Freq: Every day | ORAL | 1 refills | Status: DC

## 2022-08-03 MED ORDER — ATORVASTATIN CALCIUM 20 MG PO TABS: 20.0000 mg | ORAL_TABLET | Freq: Every day | ORAL | 1 refills | Status: DC

## 2022-08-03 MED ORDER — ESCITALOPRAM OXALATE 10 MG PO TABS: 10.0000 mg | ORAL_TABLET | Freq: Every day | ORAL | 1 refills | Status: DC

## 2022-08-03 MED ORDER — ALBUTEROL SULFATE HFA 108 (90 BASE) MCG/ACT IN AERS: 2.0000 | INHALATION_SPRAY | Freq: Four times a day (QID) | RESPIRATORY_TRACT | 5 refills | Status: DC | PRN

## 2022-08-03 NOTE — Patient Instructions (Signed)
Please continue to take medications as prescribed.  Please continue to follow low salt diet and ambulate as tolerated.  Please consider getting Shingrix and Tdap vaccines at your local pharmacy.

## 2022-08-03 NOTE — Progress Notes (Signed)
Established Patient Office Visit  Subjective:  Patient ID: Brett Elliott, male    DOB: 04-Jun-1946  Age: 76 y.o. MRN: [id number]  CC:  Chief Complaint  Patient presents with   Annual Exam    Annual exam     HPI Brett Elliott is a 76 y.o. male with past medical history of hypertension, cerebral aneurysm s/p endovascular coiling, COPD, hyperlipidemia, anxiety, tobacco abuse, osteoarthritis and chronic genital wart who presents for annual physical.  HTN: BP is well-controlled now. Takes medications regularly. Patient denies headache, dizziness, chest pain, dyspnea or palpitations.  His anxiety is improved with the Lexapro now.  Denies any anhedonia, SI or HI.  He does not have severe anger spells now.   He still smokes about 1 pack/day.  He is wheezing today.  He also reports coughing spells with clear sputum at times.  Denies any hemoptysis.  Past Medical History:  Diagnosis Date   Anxiety    Brain aneurysm    Hypertension     Past Surgical History:  Procedure Laterality Date   BRAIN SURGERY     CATARACT EXTRACTION W/ INTRAOCULAR LENS IMPLANT Bilateral    IR RADIOLOGIST EVAL & MGMT  07/03/2017    History reviewed. No pertinent family history.  Social History   Socioeconomic History   Marital status: Married    Spouse name: Not on file   Number of children: Not on file   Years of education: Not on file   Highest education level: Not on file  Occupational History   Not on file  Tobacco Use   Smoking status: Every Day    Packs/day: 1.00    Types: Cigarettes   Smokeless tobacco: Never  Substance and Sexual Activity   Alcohol use: No   Drug use: No   Sexual activity: Not on file  Other Topics Concern   Not on file  Social History Narrative   Not on file   Social Determinants of Health   Financial Resource Strain: Medium Risk (08/05/2021)   Overall Financial Resource Strain (CARDIA)    Difficulty of Paying Living Expenses: Somewhat hard  Food Insecurity: No  Food Insecurity (08/05/2021)   Hunger Vital Sign    Worried About Running Out of Food in the Last Year: Never true    Ran Out of Food in the Last Year: Never true  Transportation Needs: No Transportation Needs (08/05/2021)   PRAPARE - Hydrologist (Medical): No    Lack of Transportation (Non-Medical): No  Physical Activity: Inactive (08/05/2021)   Exercise Vital Sign    Days of Exercise per Week: 0 days    Minutes of Exercise per Session: 0 min  Stress: No Stress Concern Present (08/05/2021)   [place]    Feeling of Stress : Only a little  Social Connections: Moderately Isolated (08/05/2021)   Social Connection and Isolation Panel [NHANES]    Frequency of Communication with Friends and Family: More than three times a week    Frequency of Social Gatherings with Friends and Family: More than three times a week    Attends Religious Services: Never    Marine scientist or Organizations: No    Attends Archivist Meetings: Never    Marital Status: Married  Human resources officer Violence: Not At Risk (08/05/2021)   Humiliation, Afraid, Rape, and Kick questionnaire    Fear of Current or Ex-Partner: No    Emotionally Abused:  No    Physically Abused: No    Sexually Abused: No    Outpatient Medications Prior to Visit  Medication Sig Dispense Refill   aspirin EC 325 MG tablet Take 325 mg by mouth daily as needed for mild pain or moderate pain.     Misc. Devices MISC Blood pressure cuff - 1. 1 each 0   albuterol (VENTOLIN HFA) 108 (90 Base) MCG/ACT inhaler Inhale 2 puffs into the lungs every 6 (six) hours as needed for wheezing or shortness of breath. 8 g 5   amLODipine (NORVASC) 2.5 MG tablet Take 1 tablet (2.5 mg total) by mouth daily. 30 tablet 2   atorvastatin (LIPITOR) 20 MG tablet Take 1 tablet (20 mg total) by mouth daily. 90 tablet 1   carvedilol (COREG) 6.25 MG tablet TAKE 1 TABLET BY MOUTH  TWICE DAILY WITH A MEAL 60 tablet 0   escitalopram (LEXAPRO) 10 MG tablet Take 1 tablet (10 mg total) by mouth daily. 90 tablet 1   telmisartan (MICARDIS) 40 MG tablet Take 1 tablet (40 mg total) by mouth daily. 90 tablet 1   No facility-administered medications prior to visit.    Allergies  Allergen Reactions   Cephalexin Hives and Rash    ROS Review of Systems  Constitutional:  Negative for chills and fever.  HENT:  Negative for congestion and sore throat.   Eyes:  Negative for pain and discharge.  Respiratory:  Positive for cough. Negative for shortness of breath.   Cardiovascular:  Negative for chest pain and palpitations.  Gastrointestinal:  Negative for constipation, diarrhea, nausea and vomiting.  Endocrine: Negative for polydipsia and polyuria.  Genitourinary:  Negative for dysuria and hematuria.  Musculoskeletal:  Negative for neck pain and neck stiffness.  Skin:  Negative for rash.  Neurological:  Negative for dizziness, weakness and numbness.  Psychiatric/Behavioral:  Positive for sleep disturbance. Negative for agitation and behavioral problems. The patient is nervous/anxious.       Objective:    Physical Exam Vitals reviewed.  Constitutional:      General: He is not in acute distress.    Appearance: He is not diaphoretic.  HENT:     Head: Normocephalic and atraumatic.     Nose: Nose normal.     Mouth/Throat:     Mouth: Mucous membranes are moist.  Eyes:     General: No scleral icterus.    Extraocular Movements: Extraocular movements intact.  Cardiovascular:     Rate and Rhythm: Normal rate and regular rhythm.     Pulses: Normal pulses.     Heart sounds: Normal heart sounds. No murmur heard. Pulmonary:     Breath sounds: Wheezing (B/l diffuse) present. No rales.  Abdominal:     Palpations: Abdomen is soft.     Tenderness: There is no abdominal tenderness.  Musculoskeletal:     Cervical back: Neck supple. No tenderness.     Right lower leg: No edema.      Left lower leg: No edema.  Skin:    General: Skin is warm.     Findings: No rash.  Neurological:     General: No focal deficit present.     Mental Status: He is alert and oriented to person, place, and time.     Cranial Nerves: No cranial nerve deficit.     Sensory: No sensory deficit.     Motor: No weakness.  Psychiatric:        Mood and Affect: Mood normal.  Behavior: Behavior normal.     BP 136/82 (BP Location: Right Arm, Patient Position: Sitting, Cuff Size: Normal)   Pulse (!) 48   Resp 18   Ht '5\' 8"'  (1.727 m)   Wt 208 lb 9.6 oz (94.6 kg)   SpO2 95%   BMI 31.72 kg/m  Wt Readings from Last 3 Encounters:  08/03/22 208 lb 9.6 oz (94.6 kg)  04/27/22 210 lb 3.2 oz (95.3 kg)  04/07/22 210 lb 1.9 oz (95.3 kg)    Lab Results  Component Value Date   TSH 1.420 08/03/2022   Lab Results  Component Value Date   WBC 9.1 08/03/2022   HGB 15.9 08/03/2022   HCT 47.8 08/03/2022   MCV 94 08/03/2022   PLT 259 08/03/2022   Lab Results  Component Value Date   NA 137 08/03/2022   K 4.7 08/03/2022   CO2 25 08/03/2022   GLUCOSE 85 08/03/2022   BUN 9 08/03/2022   CREATININE 0.96 08/03/2022   BILITOT 0.4 08/03/2022   ALKPHOS 81 08/03/2022   AST 16 08/03/2022   ALT 8 08/03/2022   PROT 6.4 08/03/2022   ALBUMIN 4.2 08/03/2022   CALCIUM 9.3 08/03/2022   ANIONGAP 10 05/22/2017   EGFR 82 08/03/2022   Lab Results  Component Value Date   CHOL 197 08/03/2022   Lab Results  Component Value Date   HDL 30 (L) 08/03/2022   Lab Results  Component Value Date   LDLCALC 130 (H) 08/03/2022   Lab Results  Component Value Date   TRIG 207 (H) 08/03/2022   Lab Results  Component Value Date   CHOLHDL 6.6 (H) 08/03/2022   Lab Results  Component Value Date   HGBA1C 5.7 (H) 08/03/2022      Assessment & Plan:   Problem List Items Addressed This Visit       Cardiovascular and Mediastinum   Hypertension    BP Readings from Last 1 Encounters:  08/03/22 136/82   Well-controlled with Telmisartan, amlodipine and Coreg now Had added Coreg considering his risk for CAD Counseled for compliance with the medications Advised DASH diet and moderate exercise/walking, at least 150 mins/wee Needs to quit smoking and be compliant to medications      Relevant Medications   telmisartan (MICARDIS) 40 MG tablet   amLODipine (NORVASC) 2.5 MG tablet   atorvastatin (LIPITOR) 20 MG tablet   carvedilol (COREG) 6.25 MG tablet   Other Relevant Orders   TSH (Completed)     Respiratory   Chronic obstructive pulmonary disease (HCC)    Has wheezing and coughing spells Needs to quit smoking Continue Albuterol inhaler PRN for now, but does not use it - needs to use it for dyspnea/wheezing If persistent symptoms or frequent use of Albuterol required, will add maintenance inhaler      Relevant Medications   albuterol (VENTOLIN HFA) 108 (90 Base) MCG/ACT inhaler     Other   Hyperlipidemia   Relevant Medications   telmisartan (MICARDIS) 40 MG tablet   amLODipine (NORVASC) 2.5 MG tablet   atorvastatin (LIPITOR) 20 MG tablet   carvedilol (COREG) 6.25 MG tablet   Other Relevant Orders   Lipid panel (Completed)   Anxiety    Well-controlled, needs to take Lexapro regularly      Relevant Medications   escitalopram (LEXAPRO) 10 MG tablet   Other Relevant Orders   TSH (Completed)   Prostate cancer screening    Ordered PSA after discussing its limitations for prostate cancer screening, including false  positive results leading additional investigations.      Relevant Orders   PSA (Completed)   Encounter for general adult medical examination with abnormal findings - Primary    Physical exam as documented. Counseling done  re healthy lifestyle involving commitment to 150 minutes exercise per week, heart healthy diet, and attaining healthy weight.The importance of adequate sleep also discussed. Changes in health habits are decided on by the patient with goals and  time frames  set for achieving them. Immunization and cancer screening needs are specifically addressed at this visit.      Relevant Orders   TSH (Completed)   CMP14+EGFR (Completed)   CBC with Differential/Platelet (Completed)   Other Visit Diagnoses     Prediabetes       Relevant Orders   Hemoglobin A1c (Completed)   CMP14+EGFR (Completed)   Vitamin D deficiency       Relevant Orders   VITAMIN D 25 Hydroxy (Vit-D Deficiency, Fractures) (Completed)   Need for immunization against influenza       Relevant Orders   Flu Vaccine QUAD High Dose(Fluad) (Completed)       Meds ordered this encounter  Medications   telmisartan (MICARDIS) 40 MG tablet    Sig: Take 1 tablet (40 mg total) by mouth daily.    Dispense:  90 tablet    Refill:  1   amLODipine (NORVASC) 2.5 MG tablet    Sig: Take 1 tablet (2.5 mg total) by mouth daily.    Dispense:  90 tablet    Refill:  1   atorvastatin (LIPITOR) 20 MG tablet    Sig: Take 1 tablet (20 mg total) by mouth daily.    Dispense:  90 tablet    Refill:  1   carvedilol (COREG) 6.25 MG tablet    Sig: Take 1 tablet (6.25 mg total) by mouth 2 (two) times daily with a meal.    Dispense:  180 tablet    Refill:  1   escitalopram (LEXAPRO) 10 MG tablet    Sig: Take 1 tablet (10 mg total) by mouth daily.    Dispense:  90 tablet    Refill:  1   albuterol (VENTOLIN HFA) 108 (90 Base) MCG/ACT inhaler    Sig: Inhale 2 puffs into the lungs every 6 (six) hours as needed for wheezing or shortness of breath.    Dispense:  8 g    Refill:  5    Okay to substitute to generic/formulary Albuterol.    Follow-up: Return in about 6 months (around 02/01/2023) for HTN and COPD.    Lindell Spar, MD

## 2022-08-03 NOTE — Assessment & Plan Note (Signed)
BP Readings from Last 1 Encounters:  08/03/22 136/82   Well-controlled with Telmisartan, amlodipine and Coreg now Had added Coreg considering his risk for CAD Counseled for compliance with the medications Advised DASH diet and moderate exercise/walking, at least 150 mins/wee Needs to quit smoking and be compliant to medications

## 2022-08-04 NOTE — Assessment & Plan Note (Signed)
Has wheezing and coughing spells Needs to quit smoking Continue Albuterol inhaler PRN for now, but does not use it - needs to use it for dyspnea/wheezing If persistent symptoms or frequent use of Albuterol required, will add maintenance inhaler

## 2022-08-04 NOTE — Assessment & Plan Note (Signed)
Well-controlled, needs to take Lexapro regularly

## 2022-08-04 NOTE — Assessment & Plan Note (Signed)

## 2022-08-04 NOTE — Assessment & Plan Note (Signed)
Ordered PSA after discussing its limitations for prostate cancer screening, including false positive results leading additional investigations. 

## 2022-08-05 LAB — CMP14+EGFR
ALT: 8 IU/L (ref 0–44)
AST: 16 IU/L (ref 0–40)
Albumin/Globulin Ratio: 1.9 (ref 1.2–2.2)
Albumin: 4.2 g/dL (ref 3.8–4.8)
Alkaline Phosphatase: 81 IU/L (ref 44–121)
BUN/Creatinine Ratio: 9 — ABNORMAL LOW (ref 10–24)
BUN: 9 mg/dL (ref 8–27)
Bilirubin Total: 0.4 mg/dL (ref 0.0–1.2)
CO2: 25 mmol/L (ref 20–29)
Calcium: 9.3 mg/dL (ref 8.6–10.2)
Chloride: 98 mmol/L (ref 96–106)
Creatinine, Ser: 0.96 mg/dL (ref 0.76–1.27)
Globulin, Total: 2.2 g/dL (ref 1.5–4.5)
Glucose: 85 mg/dL (ref 70–99)
Potassium: 4.7 mmol/L (ref 3.5–5.2)
Sodium: 137 mmol/L (ref 134–144)
Total Protein: 6.4 g/dL (ref 6.0–8.5)
eGFR: 82 mL/min/{1.73_m2} (ref >59)

## 2022-08-05 LAB — VITAMIN D 25 HYDROXY (VIT D DEFICIENCY, FRACTURES): Vit D, 25-Hydroxy: 33.8 ng/mL (ref 30.0–100.0)

## 2022-08-05 LAB — CBC WITH DIFFERENTIAL/PLATELET
Basophils Absolute: 0.1 10*3/uL (ref 0.0–0.2)
Basos: 1 %
EOS (ABSOLUTE): 0.3 10*3/uL (ref 0.0–0.4)
Eos: 4 %
Hematocrit: 47.8 % (ref 37.5–51.0)
Hemoglobin: 15.9 g/dL (ref 13.0–17.7)
Immature Grans (Abs): 0.1 10*3/uL (ref 0.0–0.1)
Immature Granulocytes: 1 %
Lymphocytes Absolute: 2.6 10*3/uL (ref 0.7–3.1)
Lymphs: 29 %
MCH: 31.1 pg (ref 26.6–33.0)
MCHC: 33.3 g/dL (ref 31.5–35.7)
MCV: 94 fL (ref 79–97)
Monocytes Absolute: 0.9 10*3/uL (ref 0.1–0.9)
Monocytes: 10 %
Neutrophils Absolute: 5.1 10*3/uL (ref 1.4–7.0)
Neutrophils: 55 %
Platelets: 259 10*3/uL (ref 150–450)
RBC: 5.11 x10E6/uL (ref 4.14–5.80)
RDW: 14.4 % (ref 11.6–15.4)
WBC: 9.1 10*3/uL (ref 3.4–10.8)

## 2022-08-05 LAB — LIPID PANEL
Chol/HDL Ratio: 6.6 ratio — ABNORMAL HIGH (ref 0.0–5.0)
Cholesterol, Total: 197 mg/dL (ref 100–199)
HDL: 30 mg/dL — ABNORMAL LOW (ref >39)
LDL Chol Calc (NIH): 130 mg/dL — ABNORMAL HIGH (ref 0–99)
Triglycerides: 207 mg/dL — ABNORMAL HIGH (ref 0–149)
VLDL Cholesterol Cal: 37 mg/dL (ref 5–40)

## 2022-08-05 LAB — PSA: Prostate Specific Ag, Serum: 1.1 ng/mL (ref 0.0–4.0)

## 2022-08-05 LAB — HEMOGLOBIN A1C
Est. average glucose Bld gHb Est-mCnc: 117 mg/dL
Hgb A1c MFr Bld: 5.7 % — ABNORMAL HIGH (ref 4.8–5.6)

## 2022-08-05 LAB — TSH: TSH: 1.42 u[IU]/mL (ref 0.450–4.500)

## 2022-08-07 ENCOUNTER — Telehealth: Payer: Self-pay | Admitting: Internal Medicine

## 2022-08-07 NOTE — Telephone Encounter (Signed)
Returned call for lab results.  

## 2022-08-08 NOTE — Telephone Encounter (Signed)
LVM for pt to call the office.

## 2022-08-08 NOTE — Telephone Encounter (Signed)
Patient returning lab result call. Call back # [phone]

## 2023-02-01 ENCOUNTER — Ambulatory Visit: Payer: Medicare HMO | Admitting: Internal Medicine

## 2023-02-01 ENCOUNTER — Encounter: Payer: Self-pay | Admitting: Internal Medicine

## 2023-02-05 ENCOUNTER — Other Ambulatory Visit: Payer: Self-pay | Admitting: Internal Medicine

## 2023-02-05 DIAGNOSIS — I10 Essential (primary) hypertension: Secondary | ICD-10-CM

## 2023-03-07 ENCOUNTER — Encounter: Payer: Self-pay | Admitting: Internal Medicine

## 2023-03-07 ENCOUNTER — Ambulatory Visit (INDEPENDENT_AMBULATORY_CARE_PROVIDER_SITE_OTHER): Payer: Medicare HMO | Admitting: Internal Medicine

## 2023-03-07 VITALS — BP 138/62 | HR 76 | Ht 68.0 in | Wt 215.4 lb

## 2023-03-07 DIAGNOSIS — I10 Essential (primary) hypertension: Secondary | ICD-10-CM | POA: Diagnosis not present

## 2023-03-07 DIAGNOSIS — F17210 Nicotine dependence, cigarettes, uncomplicated: Secondary | ICD-10-CM

## 2023-03-07 DIAGNOSIS — F419 Anxiety disorder, unspecified: Secondary | ICD-10-CM | POA: Diagnosis not present

## 2023-03-07 DIAGNOSIS — J449 Chronic obstructive pulmonary disease, unspecified: Secondary | ICD-10-CM | POA: Diagnosis not present

## 2023-03-07 DIAGNOSIS — E782 Mixed hyperlipidemia: Secondary | ICD-10-CM

## 2023-03-07 DIAGNOSIS — Z720 Tobacco use: Secondary | ICD-10-CM

## 2023-03-07 MED ORDER — AMLODIPINE BESYLATE 2.5 MG PO TABS: 2.5000 mg | ORAL_TABLET | Freq: Every day | ORAL | 1 refills | Status: DC

## 2023-03-07 MED ORDER — ESCITALOPRAM OXALATE 10 MG PO TABS: 10.0000 mg | ORAL_TABLET | Freq: Every day | ORAL | 1 refills | Status: DC

## 2023-03-07 MED ORDER — ATORVASTATIN CALCIUM 20 MG PO TABS: 20.0000 mg | ORAL_TABLET | Freq: Every day | ORAL | 1 refills | Status: DC

## 2023-03-07 MED ORDER — TELMISARTAN 40 MG PO TABS: 40.0000 mg | ORAL_TABLET | Freq: Every day | ORAL | 1 refills | Status: DC

## 2023-03-07 MED ORDER — CARVEDILOL 6.25 MG PO TABS: 6.2500 mg | ORAL_TABLET | Freq: Two times a day (BID) | ORAL | 1 refills | Status: DC

## 2023-03-07 NOTE — Assessment & Plan Note (Signed)
Has wheezing and coughing spells Needs to quit smoking Continue Albuterol inhaler PRN for now, but does not use it frequently If persistent symptoms or frequent use of Albuterol required, will add maintenance inhaler

## 2023-03-07 NOTE — Progress Notes (Signed)
Established Patient Office Visit  Subjective:  Patient ID: Brett Elliott, male    DOB: [birth year]  Age: 77 y.o. MRN: [id number]  CC:  Chief Complaint  Patient presents with   Hypertension    Follow up    HPI Brett Elliott is a 77 y.o. male with past medical history of hypertension, cerebral aneurysm s/p endovascular coiling and hyperlipidemia, anxiety, tobacco abuse, osteoarthritis and chronic genital wart who presents for f/u of her chronic medical conditions.  HTN: BP is well-controlled now. Takes medications regularly. Patient denies headache, dizziness, chest pain, dyspnea or palpitations.  His wife reports that his anxiety is improved with the Lexapro now.  Denies any anhedonia, SI or HI.  He does not have severe anger spells now.  He still smokes about 1 pack/day.  He is not wheezing today.  He reports coughing spells with clear sputum at times.  Denies any hemoptysis.     Past Medical History:  Diagnosis Date   Anxiety    Brain aneurysm    Hypertension     Past Surgical History:  Procedure Laterality Date   BRAIN SURGERY     CATARACT EXTRACTION W/ INTRAOCULAR LENS IMPLANT Bilateral    IR RADIOLOGIST EVAL & MGMT  07/03/2017    History reviewed. No pertinent family history.  Social History   Socioeconomic History   Marital status: Married    Spouse name: Not on file   Number of children: Not on file   Years of education: Not on file   Highest education level: Not on file  Occupational History   Not on file  Tobacco Use   Smoking status: Every Day    Packs/day: 1    Types: Cigarettes   Smokeless tobacco: Never  Substance and Sexual Activity   Alcohol use: No   Drug use: No   Sexual activity: Not on file  Other Topics Concern   Not on file  Social History Narrative   Not on file   Social Determinants of Health   Financial Resource Strain: Medium Risk (08/05/2021)   Overall Financial Resource Strain (CARDIA)    Difficulty of Paying Living  Expenses: Somewhat hard  Food Insecurity: No Food Insecurity (08/05/2021)   Hunger Vital Sign    Worried About Running Out of Food in the Last Year: Never true    Ran Out of Food in the Last Year: Never true  Transportation Needs: No Transportation Needs (08/05/2021)   PRAPARE - Administrator, Civil Service (Medical): No    Lack of Transportation (Non-Medical): No  Physical Activity: Inactive (08/05/2021)   Exercise Vital Sign    Days of Exercise per Week: 0 days    Minutes of Exercise per Session: 0 min  Stress: No Stress Concern Present (08/05/2021)   [employer] of Occupational Health - Occupational Stress Questionnaire    Feeling of Stress : Only a little  Social Connections: Moderately Isolated (08/05/2021)   Social Connection and Isolation Panel [NHANES]    Frequency of Communication with Friends and Family: More than three times a week    Frequency of Social Gatherings with Friends and Family: More than three times a week    Attends Religious Services: Never    Database administrator or Organizations: No    Attends Banker Meetings: Never    Marital Status: Married  Catering manager Violence: Not At Risk (08/05/2021)   Humiliation, Afraid, Rape, and Kick questionnaire    Fear of Current  or Ex-Partner: No    Emotionally Abused: No    Physically Abused: No    Sexually Abused: No    Outpatient Medications Prior to Visit  Medication Sig Dispense Refill   albuterol (VENTOLIN HFA) 108 (90 Base) MCG/ACT inhaler Inhale 2 puffs into the lungs every 6 (six) hours as needed for wheezing or shortness of breath. 8 g 5   aspirin EC 325 MG tablet Take 325 mg by mouth daily as needed for mild pain or moderate pain.     Misc. Devices MISC Blood pressure cuff - 1. 1 each 0   amLODipine (NORVASC) 2.5 MG tablet Take 1 tablet by mouth once daily 90 tablet 0   atorvastatin (LIPITOR) 20 MG tablet Take 1 tablet (20 mg total) by mouth daily. 90 tablet 1   carvedilol (COREG)  6.25 MG tablet Take 1 tablet (6.25 mg total) by mouth 2 (two) times daily with a meal. 180 tablet 1   escitalopram (LEXAPRO) 10 MG tablet Take 1 tablet (10 mg total) by mouth daily. 90 tablet 1   telmisartan (MICARDIS) 40 MG tablet Take 1 tablet by mouth once daily 90 tablet 0   No facility-administered medications prior to visit.    Allergies  Allergen Reactions   Cephalexin Hives and Rash    ROS Review of Systems  Constitutional:  Negative for chills and fever.  HENT:  Negative for congestion and sore throat.   Eyes:  Negative for pain and discharge.  Respiratory:  Positive for cough. Negative for shortness of breath.   Cardiovascular:  Negative for chest pain and palpitations.  Gastrointestinal:  Negative for constipation, diarrhea, nausea and vomiting.  Endocrine: Negative for polydipsia and polyuria.  Genitourinary:  Negative for dysuria and hematuria.  Musculoskeletal:  Negative for neck pain and neck stiffness.  Skin:  Negative for rash.  Neurological:  Negative for dizziness, weakness and numbness.  Psychiatric/Behavioral:  Positive for sleep disturbance. Negative for agitation and behavioral problems. The patient is nervous/anxious.       Objective:    Physical Exam Vitals reviewed.  Constitutional:      General: He is not in acute distress.    Appearance: He is not diaphoretic.  HENT:     Head: Normocephalic and atraumatic.     Nose: Nose normal.     Mouth/Throat:     Mouth: Mucous membranes are moist.  Eyes:     General: No scleral icterus.    Extraocular Movements: Extraocular movements intact.  Cardiovascular:     Rate and Rhythm: Normal rate and regular rhythm.     Pulses: Normal pulses.     Heart sounds: Normal heart sounds. No murmur heard. Pulmonary:     Breath sounds: Normal breath sounds. No wheezing or rales.  Musculoskeletal:     Cervical back: Neck supple. No tenderness.     Right lower leg: No edema.     Left lower leg: No edema.  Skin:     General: Skin is warm.     Findings: No rash.  Neurological:     General: No focal deficit present.     Mental Status: He is alert and oriented to person, place, and time.     Sensory: No sensory deficit.     Motor: No weakness.  Psychiatric:        Mood and Affect: Mood normal.        Behavior: Behavior normal.     BP 138/62 (BP Location: Right Arm, Patient Position: Sitting, Cuff  Size: Large)   Pulse 76   Ht 5\' 8"  (1.727 m)   Wt 215 lb 6.4 oz (97.7 kg)   SpO2 94%   BMI 32.75 kg/m  Wt Readings from Last 3 Encounters:  03/07/23 215 lb 6.4 oz (97.7 kg)  08/03/22 208 lb 9.6 oz (94.6 kg)  04/27/22 210 lb 3.2 oz (95.3 kg)    Lab Results  Component Value Date   TSH 1.420 08/03/2022   Lab Results  Component Value Date   WBC 9.1 08/03/2022   HGB 15.9 08/03/2022   HCT 47.8 08/03/2022   MCV 94 08/03/2022   PLT 259 08/03/2022   Lab Results  Component Value Date   NA 137 08/03/2022   K 4.7 08/03/2022   CO2 25 08/03/2022   GLUCOSE 85 08/03/2022   BUN 9 08/03/2022   CREATININE 0.96 08/03/2022   BILITOT 0.4 08/03/2022   ALKPHOS 81 08/03/2022   AST 16 08/03/2022   ALT 8 08/03/2022   PROT 6.4 08/03/2022   ALBUMIN 4.2 08/03/2022   CALCIUM 9.3 08/03/2022   ANIONGAP 10 05/22/2017   EGFR 82 08/03/2022   Lab Results  Component Value Date   CHOL 197 08/03/2022   Lab Results  Component Value Date   HDL 30 (L) 08/03/2022   Lab Results  Component Value Date   LDLCALC 130 (H) 08/03/2022   Lab Results  Component Value Date   TRIG 207 (H) 08/03/2022   Lab Results  Component Value Date   CHOLHDL 6.6 (H) 08/03/2022   Lab Results  Component Value Date   HGBA1C 5.7 (H) 08/03/2022      Assessment & Plan:   Problem List Items Addressed This Visit       Cardiovascular and Mediastinum   Hypertension    BP Readings from Last 1 Encounters:  03/07/23 138/62  Well-controlled with Telmisartan, amlodipine and Coreg now Had added Coreg considering his risk for  CAD Counseled for compliance with the medications Advised DASH diet and moderate exercise/walking, at least 150 mins/wee Needs to quit smoking and be compliant to medications      Relevant Medications   amLODipine (NORVASC) 2.5 MG tablet   atorvastatin (LIPITOR) 20 MG tablet   carvedilol (COREG) 6.25 MG tablet   telmisartan (MICARDIS) 40 MG tablet     Respiratory   Chronic obstructive pulmonary disease - Primary    Has wheezing and coughing spells Needs to quit smoking Continue Albuterol inhaler PRN for now, but does not use it frequently If persistent symptoms or frequent use of Albuterol required, will add maintenance inhaler        Other   Hyperlipidemia    Continue Atorvastatin Lipid profile reviewed      Relevant Medications   amLODipine (NORVASC) 2.5 MG tablet   atorvastatin (LIPITOR) 20 MG tablet   carvedilol (COREG) 6.25 MG tablet   telmisartan (MICARDIS) 40 MG tablet   Tobacco abuse    Smokes about 1 pack/day  Asked about quitting: confirms that he currently smokes cigarettes Advise to quit smoking: Educated about QUITTING to reduce the risk of cancer, cardio and cerebrovascular disease. Assess willingness: Unwilling to quit at this time, but is working on cutting back. Assist with counseling and pharmacotherapy: Counseled for 5 minutes and literature provided. Nicotine patch prescribed. Arrange for follow up: follow up in 3 months and continue to offer help.      Anxiety    Well-controlled, needs to take Lexapro regularly      Relevant Medications  escitalopram (LEXAPRO) 10 MG tablet   Meds ordered this encounter  Medications   amLODipine (NORVASC) 2.5 MG tablet    Sig: Take 1 tablet (2.5 mg total) by mouth daily.    Dispense:  90 tablet    Refill:  1   atorvastatin (LIPITOR) 20 MG tablet    Sig: Take 1 tablet (20 mg total) by mouth daily.    Dispense:  90 tablet    Refill:  1   carvedilol (COREG) 6.25 MG tablet    Sig: Take 1 tablet (6.25 mg  total) by mouth 2 (two) times daily with a meal.    Dispense:  180 tablet    Refill:  1   escitalopram (LEXAPRO) 10 MG tablet    Sig: Take 1 tablet (10 mg total) by mouth daily.    Dispense:  90 tablet    Refill:  1   telmisartan (MICARDIS) 40 MG tablet    Sig: Take 1 tablet (40 mg total) by mouth daily.    Dispense:  90 tablet    Refill:  1    Follow-up: Return in about 5 months (around 08/07/2023) for Annual physical.    Anabel Halon, MD

## 2023-03-07 NOTE — Patient Instructions (Signed)
Please continue to take medications as prescribed.  Please continue to follow low salt diet and perform moderate exercise/walking at least 150 mins/week. 

## 2023-03-07 NOTE — Assessment & Plan Note (Signed)
BP Readings from Last 1 Encounters:  03/07/23 138/62   Well-controlled with Telmisartan, amlodipine and Coreg now Had added Coreg considering his risk for CAD Counseled for compliance with the medications Advised DASH diet and moderate exercise/walking, at least 150 mins/wee Needs to quit smoking and be compliant to medications

## 2023-03-07 NOTE — Assessment & Plan Note (Signed)
Continue Atorvastatin Lipid profile reviewed 

## 2023-03-07 NOTE — Assessment & Plan Note (Signed)
Smokes about 1 pack/day ? ?Asked about quitting: confirms that he currently smokes cigarettes ?Advise to quit smoking: Educated about QUITTING to reduce the risk of cancer, cardio and cerebrovascular disease. ?Assess willingness: Unwilling to quit at this time, but is working on cutting back. ?Assist with counseling and pharmacotherapy: Counseled for 5 minutes and literature provided. Nicotine patch prescribed. ?Arrange for follow up: follow up in 3 months and continue to offer help. ?

## 2023-03-07 NOTE — Assessment & Plan Note (Signed)
Well-controlled, needs to take Lexapro regularly 

## 2023-03-12 ENCOUNTER — Encounter: Payer: Medicare HMO | Admitting: Internal Medicine

## 2023-03-12 NOTE — Progress Notes (Deleted)
   HPI:[name] is a 77 y.o. male who presents for evaluation of No chief complaint on file. . For the details of today's visit, please refer to the assessment and plan.  Physical Exam: There were no vitals filed for this visit.   Physical Exam   Assessment & Plan:   There are no diagnoses linked to this encounter.    Brett Banister, MD

## 2023-03-13 ENCOUNTER — Encounter: Payer: Self-pay | Admitting: Internal Medicine

## 2023-03-13 NOTE — Progress Notes (Signed)
Erroneous encounter This encounter was created in error - please disregard. This encounter was created in error - please disregard. 

## 2023-04-25 ENCOUNTER — Ambulatory Visit (INDEPENDENT_AMBULATORY_CARE_PROVIDER_SITE_OTHER): Payer: Medicare HMO | Admitting: Internal Medicine

## 2023-04-25 ENCOUNTER — Encounter: Payer: Self-pay | Admitting: Internal Medicine

## 2023-04-25 VITALS — BP 146/69 | HR 65 | Ht 68.0 in | Wt 203.0 lb

## 2023-04-25 DIAGNOSIS — F419 Anxiety disorder, unspecified: Secondary | ICD-10-CM | POA: Diagnosis not present

## 2023-04-25 DIAGNOSIS — J449 Chronic obstructive pulmonary disease, unspecified: Secondary | ICD-10-CM

## 2023-04-25 MED ORDER — SPIRIVA RESPIMAT 2.5 MCG/ACT IN AERS: 2.0000 | INHALATION_SPRAY | Freq: Every day | RESPIRATORY_TRACT | 1 refills | Status: DC

## 2023-04-25 MED ORDER — ESCITALOPRAM OXALATE 20 MG PO TABS
20.0000 mg | ORAL_TABLET | Freq: Every day | ORAL | 1 refills | Status: DC
Start: 2023-04-25 — End: 2023-08-07

## 2023-04-25 NOTE — Assessment & Plan Note (Signed)
Patient continues to smoke tobacco, 2 packs per day.  Using albuterol frequently. Also having worsening anxiety.  Add LAMA, start Spiriva Respimat Counseled on smoking cessation, Not ready to quit today.
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
## 2023-04-25 NOTE — Patient Instructions (Signed)
Thank you, Mr.Dawon E Isola for allowing [place] to provide your care today.   I have ordered the following:  Increase in Lexapro to 20 mg daily A inhaler you use daily called Spiriva   Referrals ordered today:    Referral Orders         Ambulatory referral to [facility]       Reminders: Follow up in one month to discuss anxiety     Thurmon Fair, M.D.

## 2023-04-26 ENCOUNTER — Ambulatory Visit (INDEPENDENT_AMBULATORY_CARE_PROVIDER_SITE_OTHER): Payer: Medicare HMO | Admitting: Professional Counselor

## 2023-04-26 DIAGNOSIS — F419 Anxiety disorder, unspecified: Secondary | ICD-10-CM | POA: Diagnosis not present

## 2023-04-26 NOTE — Patient Instructions (Addendum)
Continue to take leisurely drives as this decreased you depressive symptoms.  Educate self on forgiveness and begin this process.
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
## 2023-05-02 ENCOUNTER — Telehealth: Payer: Self-pay | Admitting: Professional Counselor

## 2023-05-02 DIAGNOSIS — F419 Anxiety disorder, unspecified: Secondary | ICD-10-CM

## 2023-05-02 NOTE — BH Specialist Note (Signed)
Collaborative Care Treatment Plan Team Note  MRN: [id number] NAME: Brett Elliott  DATE: 05/03/23  Start time: Start Time: 0415 End time: Stop Time: 0430 Total time: Total Time in Minutes (Visit): 15 Documentation Time: 30 min Total Collaborative Care time: 45 min  Total number of Virtual BH Treatment Team Plan encounters: 1/4  Treatment Team Attendees: Dr. Vanetta Shawl  Diagnoses:    ICD-10-CM   1. Anxiety disorder, unspecified type  F41.9       Goals, Interventions and Follow-up Plan Goals: Decrease Anxiety Interventions: Mindfulness or Relaxation Training Recommendation Continue lexapro 20 mg daily  Obtain EKG BH specialist to follow up every other week for CBT, behavioral activation.  History of the present illness Presenting Problem/Current Symptoms:  Patient is a 77 yo male presenting to collaborative care after being referred by his PCP for anxiety. Patient presented on time, dressed casually, making good eye contact and anxious. He had a hard time sitting still during session getting up several times to walk around the office. Patient was engaged and corporative during session. His wife attended the session at his request. He has a hard time remembering certain things and has a hard time hearing. His wife was helpful during session providing information about his history.    Screenings PHQ-9 Assessments:     04/26/2023    9:08 AM 04/25/2023    2:33 PM 03/07/2023    9:49 AM  Depression screen PHQ 2/9  Decreased Interest 3 3 0  Down, Depressed, Hopeless 3 3 0  PHQ - 2 Score 6 6 0  Altered sleeping 3 3   Tired, decreased energy 3 3   Change in appetite 3 3   Feeling bad or failure about yourself  3 3   Trouble concentrating 3 3   Moving slowly or fidgety/restless 3 3   Suicidal thoughts 0 0   PHQ-9 Score 24 24   Difficult doing work/chores Somewhat difficult Somewhat difficult    GAD-7 Assessments:     04/26/2023    9:12 AM 04/25/2023    2:33 PM  GAD 7 :  Generalized Anxiety Score  Nervous, Anxious, on Edge 3 3  Control/stop worrying 3 3  Worry too much - different things 3 3  Trouble relaxing 3 3  Restless 3 3  Easily annoyed or irritable 3 3  Afraid - awful might happen 3 0  Total GAD 7 Score 21 18  Anxiety Difficulty  Somewhat difficult    Past Medical History Past Medical History:  Diagnosis Date   Anxiety    Brain aneurysm    Hypertension     Vital signs: There were no vitals filed for this visit.  Allergies:  Allergies as of 05/02/2023 - Review Complete 04/25/2023  Allergen Reaction Noted   Cephalexin Hives and Rash 02/08/2012    Medication History Current medications:  Outpatient Encounter Medications as of 05/02/2023  Medication Sig   albuterol (VENTOLIN HFA) 108 (90 Base) MCG/ACT inhaler Inhale 2 puffs into the lungs every 6 (six) hours as needed for wheezing or shortness of breath.   amLODipine (NORVASC) 2.5 MG tablet Take 1 tablet (2.5 mg total) by mouth daily.   aspirin EC 325 MG tablet Take 325 mg by mouth daily as needed for mild pain or moderate pain.   atorvastatin (LIPITOR) 20 MG tablet Take 1 tablet (20 mg total) by mouth daily.   carvedilol (COREG) 6.25 MG tablet Take 1 tablet (6.25 mg total) by mouth 2 (two) times daily with a  meal.   escitalopram (LEXAPRO) 20 MG tablet Take 1 tablet (20 mg total) by mouth daily.   Misc. Devices MISC Blood pressure cuff - 1.   telmisartan (MICARDIS) 40 MG tablet Take 1 tablet (40 mg total) by mouth daily.   Tiotropium Bromide Monohydrate (SPIRIVA RESPIMAT) 2.5 MCG/ACT AERS Inhale 2 puffs into the lungs daily.   No facility-administered encounter medications on file as of 05/02/2023.     Scribe for Treatment Team: Reuel Boom

## 2023-05-08 ENCOUNTER — Other Ambulatory Visit: Payer: Self-pay | Admitting: Internal Medicine

## 2023-05-08 DIAGNOSIS — I10 Essential (primary) hypertension: Secondary | ICD-10-CM

## 2023-05-17 ENCOUNTER — Ambulatory Visit (INDEPENDENT_AMBULATORY_CARE_PROVIDER_SITE_OTHER): Payer: Medicare HMO | Admitting: Professional Counselor

## 2023-05-17 ENCOUNTER — Other Ambulatory Visit: Payer: Self-pay | Admitting: Internal Medicine

## 2023-05-17 DIAGNOSIS — F419 Anxiety disorder, unspecified: Secondary | ICD-10-CM

## 2023-05-17 DIAGNOSIS — G4709 Other insomnia: Secondary | ICD-10-CM

## 2023-05-17 MED ORDER — TRAZODONE HCL 50 MG PO TABS
25.0000 mg | ORAL_TABLET | Freq: Every evening | ORAL | 3 refills | Status: DC | PRN
Start: 2023-05-17 — End: 2023-08-07
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
## 2023-05-17 NOTE — Progress Notes (Signed)
Collaborative Care Follow-up  MRN: [id number] NAME: Brett Elliott Date: 05/17/23  Start time: 1:00pm  End time:  1:30 pm Total time: 30 min    Reason for visit today:  Patient is presenting today for collaborative care follow up. Patient reports his mood has improved explaining he can sit still more and doesn't have to get up and walk around as much. His PHQ9 and GAD7 scores have decreased. Patient reports working some which has helped him not be as anxious. He reports his medications are helping some but would like to have something maybe twice a day. [facility] recommended he speak to his PCP about increasing his Lexapro. Patient requests having a medication he can take at nigh to help him sleep. Patient reports his relationship with his granddaughter is improving some. He reports he not pushing it but feels like she is coming around more often. [facility] encouraged him to be patient with that process. [facility] recommend he practice mindfulness and increase activities to help with the anxiety. [facility] provided wife with mindfulness education as the patient's highest level of education is 7th grade.   PHQ-9 Scores:     05/17/2023    1:07 PM 04/26/2023    9:08 AM 04/25/2023    2:33 PM 03/07/2023    9:49 AM 08/03/2022    3:49 PM  Depression screen PHQ 2/9  Decreased Interest 1 3 3  0 0  Down, Depressed, Hopeless 0 3 3 0 0  PHQ - 2 Score 1 6 6  0 0  Altered sleeping 3 3 3   0  Tired, decreased energy 3 3 3   0  Change in appetite 2 3 3   0  Feeling bad or failure about yourself  0 3 3  0  Trouble concentrating 2 3 3   0  Moving slowly or fidgety/restless 2 3 3   0  Suicidal thoughts  0 0  0  PHQ-9 Score 13 24 24   0  Difficult doing work/chores Somewhat difficult Somewhat difficult Somewhat difficult  Not difficult at all   GAD-7 Scores:     05/17/2023    1:08 PM 04/26/2023    9:12 AM 04/25/2023    2:33 PM  GAD 7 : Generalized Anxiety Score  Nervous, Anxious, on Edge 3 3 3   Control/stop worrying 3 3 3   Worry too  much - different things 3 3 3   Trouble relaxing 3 3 3   Restless 2 3 3   Easily annoyed or irritable 3 3 3   Afraid - awful might happen 0 3 0  Total GAD 7 Score 17 21 18   Anxiety Difficulty   Somewhat difficult    Stress Current stressors:   Sleep:  Patient reports its hard to fall asleep. Would like to find something to help with sleep.  Appetite:  Patient is eating more says his Coping ability:  Good Patient taking medications as prescribed:  Yes. Request something he can take twice a day so that he can take at night to help him mellow out.    Current medications:  Outpatient Encounter Medications as of 05/17/2023  Medication Sig   albuterol (VENTOLIN HFA) 108 (90 Base) MCG/ACT inhaler Inhale 2 puffs into the lungs every 6 (six) hours as needed for wheezing or shortness of breath.   amLODipine (NORVASC) 2.5 MG tablet Take 1 tablet by mouth once daily   aspirin EC 325 MG tablet Take 325 mg by mouth daily as needed for mild pain or moderate pain.   atorvastatin (LIPITOR) 20 MG tablet  Take 1 tablet (20 mg total) by mouth daily.   carvedilol (COREG) 6.25 MG tablet Take 1 tablet (6.25 mg total) by mouth 2 (two) times daily with a meal.   escitalopram (LEXAPRO) 20 MG tablet Take 1 tablet (20 mg total) by mouth daily.   Misc. Devices MISC Blood pressure cuff - 1.   telmisartan (MICARDIS) 40 MG tablet Take 1 tablet by mouth once daily   Tiotropium Bromide Monohydrate (SPIRIVA RESPIMAT) 2.5 MCG/ACT AERS Inhale 2 puffs into the lungs daily.   No facility-administered encounter medications on file as of 05/17/2023.     Self-harm Behaviors Risk Assessment Self-harm risk factors:  Low Patient endorses recent thoughts of harming self:  Denies  [place] Suicide Severity Rating Scale: Failed to redirect to the Timeline version of the REVFS SmartLink.   Danger to Others Risk Assessment Danger to others risk factors:  No Patient endorses recent thoughts of harming others:  Denies  Dynamic  Appraisal of Situational Aggression (DASA):      No data to display           Goals, Interventions and Follow-up Plan Goals: Increase healthy adjustment to current life circumstances Interventions: CBT Cognitive Behavioral Therapy Follow-up Plan:  Continue bi-weekly therapy.    Reuel Boom

## 2023-05-18 NOTE — Patient Instructions (Signed)
Practice Mindfulness Worksheet  Practice Sleep Hygiene worksheet  Follow-up with PCP about medication adjustments.

## 2023-06-06 ENCOUNTER — Ambulatory Visit: Payer: Medicare HMO | Admitting: Internal Medicine

## 2023-06-06 ENCOUNTER — Other Ambulatory Visit: Payer: Self-pay | Admitting: Internal Medicine

## 2023-06-06 DIAGNOSIS — J449 Chronic obstructive pulmonary disease, unspecified: Secondary | ICD-10-CM

## 2023-06-07 ENCOUNTER — Encounter: Payer: Self-pay | Admitting: Internal Medicine

## 2023-06-07 ENCOUNTER — Ambulatory Visit (INDEPENDENT_AMBULATORY_CARE_PROVIDER_SITE_OTHER): Payer: Medicare HMO | Admitting: Professional Counselor

## 2023-06-07 ENCOUNTER — Ambulatory Visit (INDEPENDENT_AMBULATORY_CARE_PROVIDER_SITE_OTHER): Payer: Medicare HMO | Admitting: Internal Medicine

## 2023-06-07 VITALS — BP 128/72 | HR 99 | Ht 68.0 in | Wt 208.6 lb

## 2023-06-07 DIAGNOSIS — E782 Mixed hyperlipidemia: Secondary | ICD-10-CM | POA: Diagnosis not present

## 2023-06-07 DIAGNOSIS — F17210 Nicotine dependence, cigarettes, uncomplicated: Secondary | ICD-10-CM

## 2023-06-07 DIAGNOSIS — R7303 Prediabetes: Secondary | ICD-10-CM | POA: Diagnosis not present

## 2023-06-07 DIAGNOSIS — E559 Vitamin D deficiency, unspecified: Secondary | ICD-10-CM

## 2023-06-07 DIAGNOSIS — Z125 Encounter for screening for malignant neoplasm of prostate: Secondary | ICD-10-CM | POA: Diagnosis not present

## 2023-06-07 DIAGNOSIS — F419 Anxiety disorder, unspecified: Secondary | ICD-10-CM

## 2023-06-07 DIAGNOSIS — Z720 Tobacco use: Secondary | ICD-10-CM | POA: Diagnosis not present

## 2023-06-07 DIAGNOSIS — I10 Essential (primary) hypertension: Secondary | ICD-10-CM

## 2023-06-07 NOTE — Assessment & Plan Note (Signed)
Uncontrolled, needs to take Lexapro 20 mg QD regularly Was recently placed on trazodone for insomnia and anxiety spells during afternoon - needs to start taking Followed by [facility] therapy/CCM

## 2023-06-07 NOTE — Assessment & Plan Note (Signed)
Ordered PSA after discussing its limitations for prostate cancer screening, including false positive results leading to additional investigations. 

## 2023-06-07 NOTE — Patient Instructions (Addendum)
Please start taking Trazodone as prescribed.  Please continue to cut down -> quit smoking.  Please continue to take medications as prescribed.  Please continue to follow low salt diet and perform moderate exercise/walking at least 150 mins/week.  Please get fasting blood tests done before the next visit.

## 2023-06-07 NOTE — Assessment & Plan Note (Addendum)
Continue Atorvastatin Check lipid profile

## 2023-06-07 NOTE — Patient Instructions (Signed)
Continue mindfulness and start medication.  Practice sleep hygiene in addition to medication.  Work on communicating needs and wants in your relationships.

## 2023-06-07 NOTE — Progress Notes (Signed)
Established Patient Office Visit  Subjective:  Patient ID: Brett Elliott, male    DOB: 10-Feb-1946  Age: 77 y.o. MRN: [id number]  CC:  Chief Complaint  Patient presents with   Anxiety    Discuss anxiety    HPI Brett Elliott is a 77 y.o. male with past medical history of hypertension, cerebral aneurysm s/p endovascular coiling and hyperlipidemia, anxiety, tobacco abuse, osteoarthritis and chronic genital wart who presents for f/u of her chronic medical conditions.  HTN: BP is well-controlled now. Takes medications regularly. Patient denies headache, dizziness, chest pain, dyspnea or palpitations.  His wife reports that his anxiety is uncontrolled with Lexapro now.  He is having anxiety spells especially in the evenings and has insomnia as well.  He was placed on trazodone for insomnia after discussing with [facility] therapist.  He has not started taking trazodone yet.  Denies any anhedonia, SI or HI.  He does not have severe anger spells now.  He still smokes about 0.5 pack/day.  He is not wheezing today.  He reports coughing spells with clear sputum at times.  Denies any hemoptysis.     Past Medical History:  Diagnosis Date   Anxiety    Brain aneurysm    Hypertension     Past Surgical History:  Procedure Laterality Date   BRAIN SURGERY     CATARACT EXTRACTION W/ INTRAOCULAR LENS IMPLANT Bilateral    IR RADIOLOGIST EVAL & MGMT  07/03/2017    History reviewed. No pertinent family history.  Social History   Socioeconomic History   Marital status: Married    Spouse name: Not on file   Number of children: Not on file   Years of education: Not on file   Highest education level: Not on file  Occupational History   Not on file  Tobacco Use   Smoking status: Every Day    Current packs/day: 1.00    Types: Cigarettes   Smokeless tobacco: Never  Substance and Sexual Activity   Alcohol use: No   Drug use: No   Sexual activity: Not on file  Other Topics Concern   Not on file   Social History Narrative   Not on file   Social Determinants of Health   Financial Resource Strain: Medium Risk (08/05/2021)   Overall Financial Resource Strain (CARDIA)    Difficulty of Paying Living Expenses: Somewhat hard  Food Insecurity: No Food Insecurity (08/05/2021)   Hunger Vital Sign    Worried About Running Out of Food in the Last Year: Never true    Ran Out of Food in the Last Year: Never true  Transportation Needs: No Transportation Needs (08/05/2021)   PRAPARE - Administrator, Civil Service (Medical): No    Lack of Transportation (Non-Medical): No  Physical Activity: Inactive (08/05/2021)   Exercise Vital Sign    Days of Exercise per Week: 0 days    Minutes of Exercise per Session: 0 min  Stress: No Stress Concern Present (08/05/2021)   [employer] of Occupational Health - Occupational Stress Questionnaire    Feeling of Stress : Only a little  Social Connections: Moderately Isolated (08/05/2021)   Social Connection and Isolation Panel [NHANES]    Frequency of Communication with Friends and Family: More than three times a week    Frequency of Social Gatherings with Friends and Family: More than three times a week    Attends Religious Services: Never    Database administrator or Organizations: No  Attends Banker Meetings: Never    Marital Status: Married  Catering manager Violence: Not At Risk (08/05/2021)   Humiliation, Afraid, Rape, and Kick questionnaire    Fear of Current or Ex-Partner: No    Emotionally Abused: No    Physically Abused: No    Sexually Abused: No    Outpatient Medications Prior to Visit  Medication Sig Dispense Refill   albuterol (VENTOLIN HFA) 108 (90 Base) MCG/ACT inhaler INHALE 2 PUFFS BY MOUTH EVERY 6 HOURS AS NEEDED FOR WHEEZING FOR SHORTNESS OF BREATH 9 g 0   amLODipine (NORVASC) 2.5 MG tablet Take 1 tablet by mouth once daily 90 tablet 0   aspirin EC 325 MG tablet Take 325 mg by mouth daily as needed for mild pain  or moderate pain.     atorvastatin (LIPITOR) 20 MG tablet Take 1 tablet (20 mg total) by mouth daily. 90 tablet 1   carvedilol (COREG) 6.25 MG tablet Take 1 tablet (6.25 mg total) by mouth 2 (two) times daily with a meal. 180 tablet 1   escitalopram (LEXAPRO) 20 MG tablet Take 1 tablet (20 mg total) by mouth daily. 60 tablet 1   Misc. Devices MISC Blood pressure cuff - 1. 1 each 0   telmisartan (MICARDIS) 40 MG tablet Take 1 tablet by mouth once daily 90 tablet 0   Tiotropium Bromide Monohydrate (SPIRIVA RESPIMAT) 2.5 MCG/ACT AERS Inhale 2 puffs into the lungs daily. 4 g 1   traZODone (DESYREL) 50 MG tablet Take 0.5-1 tablets (25-50 mg total) by mouth at bedtime as needed for sleep. 30 tablet 3   No facility-administered medications prior to visit.    Allergies  Allergen Reactions   Cephalexin Hives and Rash    ROS Review of Systems  Constitutional:  Negative for chills and fever.  HENT:  Negative for congestion and sore throat.   Eyes:  Negative for pain and discharge.  Respiratory:  Positive for cough. Negative for shortness of breath.   Cardiovascular:  Negative for chest pain and palpitations.  Gastrointestinal:  Negative for constipation, diarrhea, nausea and vomiting.  Endocrine: Negative for polydipsia and polyuria.  Genitourinary:  Negative for dysuria and hematuria.  Musculoskeletal:  Negative for neck pain and neck stiffness.  Skin:  Negative for rash.  Neurological:  Negative for dizziness, weakness and numbness.  Psychiatric/Behavioral:  Positive for sleep disturbance. Negative for agitation and behavioral problems. The patient is nervous/anxious.       Objective:    Physical Exam Vitals reviewed.  Constitutional:      General: He is not in acute distress.    Appearance: He is not diaphoretic.  HENT:     Head: Normocephalic and atraumatic.     Nose: Nose normal.     Mouth/Throat:     Mouth: Mucous membranes are moist.  Eyes:     General: No scleral  icterus.    Extraocular Movements: Extraocular movements intact.  Cardiovascular:     Rate and Rhythm: Normal rate and regular rhythm.     Pulses: Normal pulses.     Heart sounds: Normal heart sounds. No murmur heard. Pulmonary:     Breath sounds: Normal breath sounds. No wheezing or rales.  Musculoskeletal:     Cervical back: Neck supple. No tenderness.     Right lower leg: No edema.     Left lower leg: No edema.  Skin:    General: Skin is warm.     Findings: No rash.  Neurological:  General: No focal deficit present.     Mental Status: He is alert and oriented to person, place, and time.     Sensory: No sensory deficit.     Motor: No weakness.  Psychiatric:        Mood and Affect: Mood is anxious.        Behavior: Behavior normal.     BP 128/72   Pulse 99   Ht 5\' 8"  (1.727 m)   Wt 208 lb 9.6 oz (94.6 kg)   SpO2 94%   BMI 31.72 kg/m  Wt Readings from Last 3 Encounters:  06/07/23 208 lb 9.6 oz (94.6 kg)  04/25/23 203 lb (92.1 kg)  03/07/23 215 lb 6.4 oz (97.7 kg)    Lab Results  Component Value Date   TSH 1.420 08/03/2022   Lab Results  Component Value Date   WBC 9.1 08/03/2022   HGB 15.9 08/03/2022   HCT 47.8 08/03/2022   MCV 94 08/03/2022   PLT 259 08/03/2022   Lab Results  Component Value Date   NA 137 08/03/2022   K 4.7 08/03/2022   CO2 25 08/03/2022   GLUCOSE 85 08/03/2022   BUN 9 08/03/2022   CREATININE 0.96 08/03/2022   BILITOT 0.4 08/03/2022   ALKPHOS 81 08/03/2022   AST 16 08/03/2022   ALT 8 08/03/2022   PROT 6.4 08/03/2022   ALBUMIN 4.2 08/03/2022   CALCIUM 9.3 08/03/2022   ANIONGAP 10 05/22/2017   EGFR 82 08/03/2022   Lab Results  Component Value Date   CHOL 197 08/03/2022   Lab Results  Component Value Date   HDL 30 (L) 08/03/2022   Lab Results  Component Value Date   LDLCALC 130 (H) 08/03/2022   Lab Results  Component Value Date   TRIG 207 (H) 08/03/2022   Lab Results  Component Value Date   CHOLHDL 6.6 (H)  08/03/2022   Lab Results  Component Value Date   HGBA1C 5.7 (H) 08/03/2022      Assessment & Plan:   Problem List Items Addressed This Visit       Cardiovascular and Mediastinum   Hypertension    BP Readings from Last 1 Encounters:  06/07/23 128/72   Well-controlled with Telmisartan, amlodipine and Coreg now Had added Coreg considering his risk for CAD Counseled for compliance with the medications Advised DASH diet and moderate exercise/walking, at least 150 mins/wee Needs to quit smoking and be compliant to medications      Relevant Orders   TSH   CMP14+EGFR   CBC with Differential/Platelet     Other   Hyperlipidemia    Continue Atorvastatin Check lipid profile      Relevant Orders   Lipid panel   Tobacco abuse    Smokes about 0.5 pack/day  Asked about quitting: confirms that he currently smokes cigarettes Advise to quit smoking: Educated about QUITTING to reduce the risk of cancer, cardio and cerebrovascular disease. Assess willingness: Unwilling to quit at this time, but is working on cutting back. Assist with counseling and pharmacotherapy: Counseled for 5 minutes and literature provided. Nicotine patch prescribed. Arrange for follow up: follow up in 3 months and continue to offer help.      Anxiety - Primary    Uncontrolled, needs to take Lexapro 20 mg QD regularly Was recently placed on trazodone for insomnia and anxiety spells during afternoon - needs to start taking Followed by [facility] therapy/CCM       Relevant Orders   TSH  Prostate cancer screening    Ordered PSA after discussing its limitations for prostate cancer screening, including false positive results leading to additional investigations.      Relevant Orders   PSA   Other Visit Diagnoses     Prediabetes       Relevant Orders   Hemoglobin A1c   Vitamin D deficiency       Relevant Orders   VITAMIN D 25 Hydroxy (Vit-D Deficiency, Fractures)       No orders of the defined types  were placed in this encounter.   Follow-up: Return if symptoms worsen or fail to improve.    Anabel Halon, MD

## 2023-06-07 NOTE — BH Specialist Note (Signed)
[facility] Follow-up  MRN: [id number] NAME: TAKAI CHIARAMONTE Date: 06/07/23  Start time: Start Time: 0300 End time: Stop Time: 0330 Total time: Total Time in Minutes (Visit): 30 Call number: Visit Number: 4- Fourth Visit  Reason for call today:  The patient is a 77 year old male presenting for a collaborative care follow-up. He reports an improved overall mood, better relationships, and a significant reduction in anxiety. However, he continues to experience trouble sleeping. Although his primary care physician prescribed Trezino, he has not yet started this medication due to his wife's concerns about potential side effects. The behavioral health counselor recommended that he begin taking the medication as prescribed and report any side effects.  The patient has been practicing mindfulness, such as sitting on his porch and trying to enjoy the present moment. His PHQ-9 scores and anxiety scores have both decreased significantly, indicating consistent improvement in his mental health. He has no suicidal thoughts, plans, or intent. A follow-up appointment is scheduled in four weeks.   PHQ-9 Scores:     06/07/2023    3:02 PM 05/17/2023    1:07 PM 04/26/2023    9:08 AM 04/25/2023    2:33 PM 03/07/2023    9:49 AM  Depression screen PHQ 2/9  Decreased Interest 2 1 3 3  0  Down, Depressed, Hopeless 2 0 3 3 0  PHQ - 2 Score 4 1 6 6  0  Altered sleeping 3 3 3 3    Tired, decreased energy 3 3 3 3    Change in appetite 0 2 3 3    Feeling bad or failure about yourself  0 0 3 3   Trouble concentrating 0 2 3 3    Moving slowly or fidgety/restless 0 2 3 3    Suicidal thoughts 0  0 0   PHQ-9 Score 10 13 24 24    Difficult doing work/chores  Somewhat difficult Somewhat difficult Somewhat difficult    GAD-7 Scores:     06/07/2023    3:02 PM 05/17/2023    1:08 PM 04/26/2023    9:12 AM 04/25/2023    2:33 PM  GAD 7 : Generalized Anxiety Score  Nervous, Anxious, on Edge 0 3 3 3   Control/stop worrying 0 3 3 3    Worry too much - different things 0 3 3 3   Trouble relaxing 0 3 3 3   Restless 0 2 3 3   Easily annoyed or irritable 0 3 3 3   Afraid - awful might happen 0 0 3 0  Total GAD 7 Score 0 17 21 18   Anxiety Difficulty    Somewhat difficult    Stress Current stressors:  Sleep problems Sleep:  Disrupted Appetite:  Fair Coping ability:  Fair Patient taking medications as prescribed:  Has not started medication  Current medications:  Outpatient Encounter Medications as of 06/07/2023  Medication Sig   albuterol (VENTOLIN HFA) 108 (90 Base) MCG/ACT inhaler INHALE 2 PUFFS BY MOUTH EVERY 6 HOURS AS NEEDED FOR WHEEZING FOR SHORTNESS OF BREATH   amLODipine (NORVASC) 2.5 MG tablet Take 1 tablet by mouth once daily   aspirin EC 325 MG tablet Take 325 mg by mouth daily as needed for mild pain or moderate pain.   atorvastatin (LIPITOR) 20 MG tablet Take 1 tablet (20 mg total) by mouth daily.   carvedilol (COREG) 6.25 MG tablet Take 1 tablet (6.25 mg total) by mouth 2 (two) times daily with a meal.   escitalopram (LEXAPRO) 20 MG tablet Take 1 tablet (20 mg total) by mouth daily.  Misc. Devices MISC Blood pressure cuff - 1.   telmisartan (MICARDIS) 40 MG tablet Take 1 tablet by mouth once daily   Tiotropium Bromide Monohydrate (SPIRIVA RESPIMAT) 2.5 MCG/ACT AERS Inhale 2 puffs into the lungs daily.   traZODone (DESYREL) 50 MG tablet Take 0.5-1 tablets (25-50 mg total) by mouth at bedtime as needed for sleep.   No facility-administered encounter medications on file as of 06/07/2023.    Goals, Interventions and Follow-up Plan Goals: Increase healthy adjustment to current life circumstances Interventions: CBT Cognitive Behavioral Therapy Follow-up Plan: Follow up in 4 weeks  Reuel Boom

## 2023-06-07 NOTE — Assessment & Plan Note (Signed)
Smokes about 0.5 pack/day  Asked about quitting: confirms that he currently smokes cigarettes Advise to quit smoking: Educated about QUITTING to reduce the risk of cancer, cardio and cerebrovascular disease. Assess willingness: Unwilling to quit at this time, but is working on cutting back. Assist with counseling and pharmacotherapy: Counseled for 5 minutes and literature provided. Nicotine patch prescribed. Arrange for follow up: follow up in 3 months and continue to offer help.

## 2023-06-07 NOTE — Assessment & Plan Note (Signed)
BP Readings from Last 1 Encounters:  06/07/23 128/72   Well-controlled with Telmisartan, amlodipine and Coreg now Had added Coreg considering his risk for CAD Counseled for compliance with the medications Advised DASH diet and moderate exercise/walking, at least 150 mins/wee Needs to quit smoking and be compliant to medications

## 2023-06-27 ENCOUNTER — Telehealth (INDEPENDENT_AMBULATORY_CARE_PROVIDER_SITE_OTHER): Payer: Medicare HMO | Admitting: Professional Counselor

## 2023-06-27 DIAGNOSIS — F419 Anxiety disorder, unspecified: Secondary | ICD-10-CM

## 2023-06-27 NOTE — BH Specialist Note (Signed)
Behavioral Health Treatment Plan Team Note  MRN: [id number] NAME: Brett Elliott  DATE: 06/29/23  Start time: Start Time: 0240 End time: Stop Time: 0255 Total time: Total Time in Minutes (Visit): 15  Total number of Virtual BH Treatment Team Plan encounters: 2/4  Treatment Team Attendees: Dr. Vanetta Shawl and Esmond Harps  Diagnoses:    ICD-10-CM   1. Anxiety disorder, unspecified type  F41.9       Goals, Interventions and Follow-up Plan Goals: Increase healthy adjustment to current life circumstances Interventions: CBT Cognitive Behavioral Therapy Medication Management Recommendations: Obtain EKG to monitor QTC prolongation given Lexapro was uptitrated.  Follow-up Plan: Discuss progress on current goals find out what worked for him. Determine new goals or see if he feels ready to transition out of collaborative care.  History of the present illness Presenting Problem/Current Symptoms:  The patient is a 77 year old male presenting for a collaborative care follow-up. He reports an improved overall mood, better relationships, and a significant reduction in anxiety. However, he continues to experience trouble sleeping. Although his primary care physician prescribed Trezino, he has not yet started this medication due to his wife's concerns about potential side effects. The behavioral health counselor recommended that he begin taking the medication as prescribed and report any side effects.   The patient has been practicing mindfulness, such as sitting on his porch and trying to enjoy the present moment. His PHQ-9 scores and anxiety scores have both decreased significantly, indicating consistent improvement in his mental health. He has no suicidal thoughts, plans, or intent. A follow-up appointment is scheduled in four weeks.  Screenings PHQ-9 Assessments:     06/07/2023    3:02 PM 05/17/2023    1:07 PM 04/26/2023    9:08 AM  Depression screen PHQ 2/9  Decreased Interest 2 1 3   Down,  Depressed, Hopeless 2 0 3  PHQ - 2 Score 4 1 6   Altered sleeping 3 3 3   Tired, decreased energy 3 3 3   Change in appetite 0 2 3  Feeling bad or failure about yourself  0 0 3  Trouble concentrating 0 2 3  Moving slowly or fidgety/restless 0 2 3  Suicidal thoughts 0  0  PHQ-9 Score 10 13 24   Difficult doing work/chores  Somewhat difficult Somewhat difficult   GAD-7 Assessments:     06/07/2023    3:02 PM 05/17/2023    1:08 PM 04/26/2023    9:12 AM 04/25/2023    2:33 PM  GAD 7 : Generalized Anxiety Score  Nervous, Anxious, on Edge 0 3 3 3   Control/stop worrying 0 3 3 3   Worry too much - different things 0 3 3 3   Trouble relaxing 0 3 3 3   Restless 0 2 3 3   Easily annoyed or irritable 0 3 3 3   Afraid - awful might happen 0 0 3 0  Total GAD 7 Score 0 17 21 18   Anxiety Difficulty    Somewhat difficult    Past Medical History Past Medical History:  Diagnosis Date   Anxiety    Brain aneurysm    Hypertension     Vital signs: There were no vitals filed for this visit.  Allergies:  Allergies as of 06/27/2023 - Review Complete 06/07/2023  Allergen Reaction Noted   Cephalexin Hives and Rash 02/08/2012    Medication History Current medications:  Outpatient Encounter Medications as of 06/27/2023  Medication Sig   albuterol (VENTOLIN HFA) 108 (90 Base) MCG/ACT inhaler INHALE 2 PUFFS BY MOUTH  EVERY 6 HOURS AS NEEDED FOR WHEEZING FOR SHORTNESS OF BREATH   amLODipine (NORVASC) 2.5 MG tablet Take 1 tablet by mouth once daily   aspirin EC 325 MG tablet Take 325 mg by mouth daily as needed for mild pain or moderate pain.   atorvastatin (LIPITOR) 20 MG tablet Take 1 tablet (20 mg total) by mouth daily.   carvedilol (COREG) 6.25 MG tablet Take 1 tablet (6.25 mg total) by mouth 2 (two) times daily with a meal.   escitalopram (LEXAPRO) 20 MG tablet Take 1 tablet (20 mg total) by mouth daily.   Misc. Devices MISC Blood pressure cuff - 1.   telmisartan (MICARDIS) 40 MG tablet Take 1 tablet by  mouth once daily   Tiotropium Bromide Monohydrate (SPIRIVA RESPIMAT) 2.5 MCG/ACT AERS Inhale 2 puffs into the lungs daily.   traZODone (DESYREL) 50 MG tablet Take 0.5-1 tablets (25-50 mg total) by mouth at bedtime as needed for sleep.   No facility-administered encounter medications on file as of 06/27/2023.     Scribe for Treatment Team: Reuel Boom

## 2023-07-03 ENCOUNTER — Other Ambulatory Visit: Payer: Self-pay | Admitting: Internal Medicine

## 2023-07-03 DIAGNOSIS — J449 Chronic obstructive pulmonary disease, unspecified: Secondary | ICD-10-CM

## 2023-07-05 ENCOUNTER — Ambulatory Visit (INDEPENDENT_AMBULATORY_CARE_PROVIDER_SITE_OTHER): Payer: Medicare HMO | Admitting: Professional Counselor

## 2023-07-05 DIAGNOSIS — F419 Anxiety disorder, unspecified: Secondary | ICD-10-CM

## 2023-07-05 NOTE — BH Specialist Note (Signed)
[facility] BH Follow-up  MRN: [id number] NAME: BRANDONLEE DONICA Date: 07/05/23  Start time: Start Time: 1230 End time: Stop Time: 1300 Total time: Total Time in Minutes (Visit): 30 Call number: Visit Number: 6-Sixth Visit  Reason for call today:  The patient is a 77 year old male who presented for a collaborative care follow-up, which was switched to a virtual format due to [employer]. The patient agreed to the virtual visit and is aware of the limitations of telehealth. He reports an overall improvement in mood and a significant reduction in stress, particularly related to his relationship with his granddaughter, which has greatly improved. The patient recently started Trazodone, finding it very effective in improving his sleep, which in turn positively impacts his mood. Additionally, he notes enhanced communication with his wife, expressing his feelings and needs more effectively, which has strengthened their relationship. The patient reports an increased interest in pleasurable activities and improved sleep, with no thoughts of suicidality or self-harm. Overall, he feels more positive, less worried, and calmer. The behavioral health counselor discussed the patient's progress, noting that he has met some of his earlier goals. The patient expressed a desire to continue with biweekly sessions for relapse prevention and maintenance of his improved mood. A follow-up appointment is scheduled in two weeks.  PHQ-9 Scores:     07/05/2023    2:06 PM 06/07/2023    3:02 PM 05/17/2023    1:07 PM 04/26/2023    9:08 AM 04/25/2023    2:33 PM  Depression screen PHQ 2/9  Decreased Interest 0 2 1 3 3   Down, Depressed, Hopeless 1 2 0 3 3  PHQ - 2 Score 1 4 1 6 6   Altered sleeping 0 3 3 3 3   Tired, decreased energy 1 3 3 3 3   Change in appetite 0 0 2 3 3   Feeling bad or failure about yourself  0 0 0 3 3  Trouble concentrating 0 0 2 3 3   Moving slowly or fidgety/restless 0 0 2 3 3   Suicidal thoughts 0  0  0 0  PHQ-9 Score 2 10 13 24 24   Difficult doing work/chores   Somewhat difficult Somewhat difficult Somewhat difficult   GAD-7 Scores:     07/05/2023    2:07 PM 06/07/2023    3:02 PM 05/17/2023    1:08 PM 04/26/2023    9:12 AM  GAD 7 : Generalized Anxiety Score  Nervous, Anxious, on Edge 0 0 3 3  Control/stop worrying 0 0 3 3  Worry too much - different things 0 0 3 3  Trouble relaxing 0 0 3 3  Restless 0 0 2 3  Easily annoyed or irritable 0 0 3 3  Afraid - awful might happen 0 0 0 3  Total GAD 7 Score 0 0 17 21  Anxiety Difficulty Somewhat difficult       Stress Current stressors:  Wife's back pain Sleep:  Good Appetite:  Good Coping ability:  Good Patient taking medications as prescribed:  Yes  Current medications:  Outpatient Encounter Medications as of 07/05/2023  Medication Sig   albuterol (VENTOLIN HFA) 108 (90 Base) MCG/ACT inhaler INHALE 2 PUFFS BY MOUTH EVERY 6 HOURS AS NEEDED FOR WHEEZING FOR SHORTNESS OF BREATH   amLODipine (NORVASC) 2.5 MG tablet Take 1 tablet by mouth once daily   aspirin EC 325 MG tablet Take 325 mg by mouth daily as needed for mild pain or moderate pain.   atorvastatin (LIPITOR) 20 MG tablet  Take 1 tablet (20 mg total) by mouth daily.   carvedilol (COREG) 6.25 MG tablet Take 1 tablet (6.25 mg total) by mouth 2 (two) times daily with a meal.   escitalopram (LEXAPRO) 20 MG tablet Take 1 tablet (20 mg total) by mouth daily.   Misc. Devices MISC Blood pressure cuff - 1.   telmisartan (MICARDIS) 40 MG tablet Take 1 tablet by mouth once daily   Tiotropium Bromide Monohydrate (SPIRIVA RESPIMAT) 2.5 MCG/ACT AERS Inhale 2 puffs into the lungs daily.   traZODone (DESYREL) 50 MG tablet Take 0.5-1 tablets (25-50 mg total) by mouth at bedtime as needed for sleep.   No facility-administered encounter medications on file as of 07/05/2023.     Self-harm Behaviors Risk Assessment Self-harm risk factors:  None Patient endorses recent thoughts of harming self:   Yes   Danger to Others Risk Assessment Danger to others risk factors:  Low Patient endorses recent thoughts of harming others:  Denies    Alcohol Use Disorder Identification Test (AUDIT):     08/05/2021    3:22 PM  Alcohol Use Disorder Test (AUDIT)  1. How often do you have a drink containing alcohol? 0  2. How many drinks containing alcohol do you have on a typical day when you are drinking? 0  3. How often do you have six or more drinks on one occasion? 0  AUDIT-C Score 0   Goals, Interventions and Follow-up Plan Goals: Increase healthy adjustment to current life circumstances Interventions: Mindfulness or Relaxation Training and CBT Cognitive Behavioral Therapy Follow-up Plan:  Bi-weekly counseling   Reuel Boom

## 2023-07-19 ENCOUNTER — Ambulatory Visit: Payer: Medicare HMO | Admitting: Professional Counselor

## 2023-08-07 ENCOUNTER — Ambulatory Visit (INDEPENDENT_AMBULATORY_CARE_PROVIDER_SITE_OTHER): Payer: Medicare HMO | Admitting: Internal Medicine

## 2023-08-07 ENCOUNTER — Encounter: Payer: Self-pay | Admitting: Internal Medicine

## 2023-08-07 VITALS — BP 160/82 | HR 56 | Ht 68.0 in | Wt 212.6 lb

## 2023-08-07 DIAGNOSIS — E782 Mixed hyperlipidemia: Secondary | ICD-10-CM

## 2023-08-07 DIAGNOSIS — I10 Essential (primary) hypertension: Secondary | ICD-10-CM | POA: Diagnosis not present

## 2023-08-07 DIAGNOSIS — F419 Anxiety disorder, unspecified: Secondary | ICD-10-CM

## 2023-08-07 DIAGNOSIS — J449 Chronic obstructive pulmonary disease, unspecified: Secondary | ICD-10-CM

## 2023-08-07 DIAGNOSIS — G4709 Other insomnia: Secondary | ICD-10-CM

## 2023-08-07 DIAGNOSIS — Z0001 Encounter for general adult medical examination with abnormal findings: Secondary | ICD-10-CM | POA: Diagnosis not present

## 2023-08-07 MED ORDER — TRAZODONE HCL 50 MG PO TABS: 25.0000 mg | ORAL_TABLET | Freq: Every evening | ORAL | 3 refills | Status: DC | PRN

## 2023-08-07 MED ORDER — ATORVASTATIN CALCIUM 20 MG PO TABS
20.0000 mg | ORAL_TABLET | Freq: Every day | ORAL | 1 refills | Status: DC
Start: 2023-08-07 — End: 2024-02-11

## 2023-08-07 MED ORDER — ESCITALOPRAM OXALATE 20 MG PO TABS
20.0000 mg | ORAL_TABLET | Freq: Every day | ORAL | 1 refills | Status: DC
Start: 2023-08-07 — End: 2024-01-23

## 2023-08-07 MED ORDER — TELMISARTAN 80 MG PO TABS
80.0000 mg | ORAL_TABLET | Freq: Every day | ORAL | 1 refills | Status: DC
Start: 2023-08-07 — End: 2024-02-11

## 2023-08-07 MED ORDER — CARVEDILOL 6.25 MG PO TABS
6.2500 mg | ORAL_TABLET | Freq: Two times a day (BID) | ORAL | 1 refills | Status: DC
Start: 2023-08-07 — End: 2024-02-11

## 2023-08-07 MED ORDER — AMLODIPINE BESYLATE 2.5 MG PO TABS
2.5000 mg | ORAL_TABLET | Freq: Every day | ORAL | 1 refills | Status: DC
Start: 2023-08-07 — End: 2024-02-11

## 2023-08-07 NOTE — Progress Notes (Unsigned)
Established Patient Office Visit  Subjective:  Patient ID: Brett Elliott, male    DOB: [birth year]  Age: 77 y.o. MRN: [id number]  CC:  Chief Complaint  Patient presents with   Annual Exam    HPI Brett Elliott is a 77 y.o. male with past medical history of hypertension, cerebral aneurysm s/p endovascular coiling and hyperlipidemia, anxiety, tobacco abuse, osteoarthritis and chronic genital wart who presents for annual physical.  HTN: BP is elevated today. Takes medications regularly. Patient denies headache, dizziness, chest pain, dyspnea or palpitations.   His wife reports that his anxiety is better with Lexapro and Trazodone now.  He was placed on trazodone for insomnia after discussing with [facility] therapist. Denies any anhedonia, SI or HI.  He does not have severe anger spells now.  He still smokes about 0.5 pack/day.  He is not wheezing today.  He reports coughing spells with clear sputum at times.  Denies any hemoptysis.  Past Medical History:  Diagnosis Date   Anxiety    Brain aneurysm    Hypertension     Past Surgical History:  Procedure Laterality Date   BRAIN SURGERY     CATARACT EXTRACTION W/ INTRAOCULAR LENS IMPLANT Bilateral    IR RADIOLOGIST EVAL & MGMT  07/03/2017    History reviewed. No pertinent family history.  Social History   Socioeconomic History   Marital status: Married    Spouse name: Not on file   Number of children: Not on file   Years of education: Not on file   Highest education level: Not on file  Occupational History   Not on file  Tobacco Use   Smoking status: Every Day    Current packs/day: 1.00    Types: Cigarettes   Smokeless tobacco: Never  Substance and Sexual Activity   Alcohol use: No   Drug use: No   Sexual activity: Not on file  Other Topics Concern   Not on file  Social History Narrative   Not on file   Social Determinants of Health   Financial Resource Strain: Medium Risk (08/05/2021)   Overall Financial Resource  Strain (CARDIA)    Difficulty of Paying Living Expenses: Somewhat hard  Food Insecurity: No Food Insecurity (08/05/2021)   Hunger Vital Sign    Worried About Running Out of Food in the Last Year: Never true    Ran Out of Food in the Last Year: Never true  Transportation Needs: No Transportation Needs (08/05/2021)   PRAPARE - Administrator, Civil Service (Medical): No    Lack of Transportation (Non-Medical): No  Physical Activity: Inactive (08/05/2021)   Exercise Vital Sign    Days of Exercise per Week: 0 days    Minutes of Exercise per Session: 0 min  Stress: No Stress Concern Present (08/05/2021)   [employer] of Occupational Health - Occupational Stress Questionnaire    Feeling of Stress : Only a little  Social Connections: Moderately Isolated (08/05/2021)   Social Connection and Isolation Panel [NHANES]    Frequency of Communication with Friends and Family: More than three times a week    Frequency of Social Gatherings with Friends and Family: More than three times a week    Attends Religious Services: Never    Database administrator or Organizations: No    Attends Banker Meetings: Never    Marital Status: Married  Catering manager Violence: Not At Risk (08/05/2021)   Humiliation, Afraid, Rape, and Kick questionnaire  Fear of Current or Ex-Partner: No    Emotionally Abused: No    Physically Abused: No    Sexually Abused: No    Outpatient Medications Prior to Visit  Medication Sig Dispense Refill   albuterol (VENTOLIN HFA) 108 (90 Base) MCG/ACT inhaler INHALE 2 PUFFS BY MOUTH EVERY 6 HOURS AS NEEDED FOR WHEEZING FOR SHORTNESS OF BREATH 9 g 0   aspirin EC 325 MG tablet Take 325 mg by mouth daily as needed for mild pain or moderate pain.     Misc. Devices MISC Blood pressure cuff - 1. 1 each 0   Tiotropium Bromide Monohydrate (SPIRIVA RESPIMAT) 2.5 MCG/ACT AERS Inhale 2 puffs into the lungs daily. 4 g 1   amLODipine (NORVASC) 2.5 MG tablet Take 1 tablet by  mouth once daily 90 tablet 0   atorvastatin (LIPITOR) 20 MG tablet Take 1 tablet (20 mg total) by mouth daily. 90 tablet 1   carvedilol (COREG) 6.25 MG tablet Take 1 tablet (6.25 mg total) by mouth 2 (two) times daily with a meal. 180 tablet 1   escitalopram (LEXAPRO) 20 MG tablet Take 1 tablet (20 mg total) by mouth daily. 60 tablet 1   telmisartan (MICARDIS) 40 MG tablet Take 1 tablet by mouth once daily 90 tablet 0   traZODone (DESYREL) 50 MG tablet Take 0.5-1 tablets (25-50 mg total) by mouth at bedtime as needed for sleep. 30 tablet 3   No facility-administered medications prior to visit.    Allergies  Allergen Reactions   Cephalexin Hives and Rash    ROS Review of Systems    Objective:    Physical Exam  BP (!) 158/82 (BP Location: Left Arm)   Pulse (!) 56   Ht 5\' 8"  (1.727 m)   Wt 212 lb 9.6 oz (96.4 kg)   SpO2 92%   BMI 32.33 kg/m  Wt Readings from Last 3 Encounters:  08/07/23 212 lb 9.6 oz (96.4 kg)  06/07/23 208 lb 9.6 oz (94.6 kg)  04/25/23 203 lb (92.1 kg)    Lab Results  Component Value Date   TSH 1.420 08/03/2022   Lab Results  Component Value Date   WBC 9.1 08/03/2022   HGB 15.9 08/03/2022   HCT 47.8 08/03/2022   MCV 94 08/03/2022   PLT 259 08/03/2022   Lab Results  Component Value Date   NA 137 08/03/2022   K 4.7 08/03/2022   CO2 25 08/03/2022   GLUCOSE 85 08/03/2022   BUN 9 08/03/2022   CREATININE 0.96 08/03/2022   BILITOT 0.4 08/03/2022   ALKPHOS 81 08/03/2022   AST 16 08/03/2022   ALT 8 08/03/2022   PROT 6.4 08/03/2022   ALBUMIN 4.2 08/03/2022   CALCIUM 9.3 08/03/2022   ANIONGAP 10 05/22/2017   EGFR 82 08/03/2022   Lab Results  Component Value Date   CHOL 197 08/03/2022   Lab Results  Component Value Date   HDL 30 (L) 08/03/2022   Lab Results  Component Value Date   LDLCALC 130 (H) 08/03/2022   Lab Results  Component Value Date   TRIG 207 (H) 08/03/2022   Lab Results  Component Value Date   CHOLHDL 6.6 (H)  08/03/2022   Lab Results  Component Value Date   HGBA1C 5.7 (H) 08/03/2022      Assessment & Plan:   Problem List Items Addressed This Visit       Cardiovascular and Mediastinum   Hypertension    BP Readings from Last 1 Encounters:  08/07/23 (!) 158/82   Uncontrolled with Telmisartan 40 mg QD, amlodipine 2.5 mg QD and Coreg 6.25 mg BID now Increased dose of telmisartan to 80 mg QD Had added Coreg considering his risk for CAD Counseled for compliance with the medications Advised DASH diet and moderate exercise/walking, at least 150 mins/wee Needs to quit smoking and be compliant to medications      Relevant Medications   telmisartan (MICARDIS) 80 MG tablet   atorvastatin (LIPITOR) 20 MG tablet   carvedilol (COREG) 6.25 MG tablet   amLODipine (NORVASC) 2.5 MG tablet     Respiratory   Chronic obstructive pulmonary disease (HCC)    Has wheezing and coughing spells Needs to quit smoking Continue Albuterol inhaler PRN for now, but does not use it frequently If persistent symptoms or frequent use of Albuterol required, will add maintenance inhaler        Other   Hyperlipidemia    Continue Atorvastatin Check lipid profile      Relevant Medications   telmisartan (MICARDIS) 80 MG tablet   atorvastatin (LIPITOR) 20 MG tablet   carvedilol (COREG) 6.25 MG tablet   amLODipine (NORVASC) 2.5 MG tablet   Anxiety    Well-controlled, needs to take Lexapro 20 mg QD regularly Was recently placed on trazodone for insomnia and anxiety spells during afternoon Followed by [facility] therapy/CCM       Relevant Medications   escitalopram (LEXAPRO) 20 MG tablet   traZODone (DESYREL) 50 MG tablet   Encounter for general adult medical examination with abnormal findings - Primary    Physical exam as documented. Counseling done  re healthy lifestyle involving commitment to 150 minutes exercise per week, heart healthy diet, and attaining healthy weight.The importance of adequate sleep also  discussed. Changes in health habits are decided on by the patient with goals and time frames  set for achieving them. Immunization and cancer screening needs are specifically addressed at this visit.      Insomnia    Improved with Trazodone      Relevant Medications   traZODone (DESYREL) 50 MG tablet    Meds ordered this encounter  Medications   escitalopram (LEXAPRO) 20 MG tablet    Sig: Take 1 tablet (20 mg total) by mouth daily.    Dispense:  90 tablet    Refill:  1   telmisartan (MICARDIS) 80 MG tablet    Sig: Take 1 tablet (80 mg total) by mouth daily.    Dispense:  90 tablet    Refill:  1    Dose change   atorvastatin (LIPITOR) 20 MG tablet    Sig: Take 1 tablet (20 mg total) by mouth daily.    Dispense:  90 tablet    Refill:  1   carvedilol (COREG) 6.25 MG tablet    Sig: Take 1 tablet (6.25 mg total) by mouth 2 (two) times daily with a meal.    Dispense:  180 tablet    Refill:  1   amLODipine (NORVASC) 2.5 MG tablet    Sig: Take 1 tablet (2.5 mg total) by mouth daily.    Dispense:  90 tablet    Refill:  1   traZODone (DESYREL) 50 MG tablet    Sig: Take 0.5-1 tablets (25-50 mg total) by mouth at bedtime as needed for sleep.    Dispense:  30 tablet    Refill:  3    Follow-up: Return in about 3 months (around 11/06/2023) for HTN.    Emeree Mahler Concha Se,  MD

## 2023-08-07 NOTE — Progress Notes (Unsigned)
Established Patient Office Visit  Subjective:  Patient ID: Brett Elliott, male    DOB: [birth year]  Age: 76 y.o. MRN: [id number]  CC:  Chief Complaint  Patient presents with  . Annual Exam    HPI Brett Elliott is a 77 y.o. male with past medical history of hypertension, cerebral aneurysm s/p endovascular coiling, COPD, hyperlipidemia, anxiety, tobacco abuse, osteoarthritis and chronic genital wart who presents for annual physical.      Past Medical History:  Diagnosis Date  . Anxiety   . Brain aneurysm   . Hypertension     Past Surgical History:  Procedure Laterality Date  . BRAIN SURGERY    . CATARACT EXTRACTION W/ INTRAOCULAR LENS IMPLANT Bilateral   . IR RADIOLOGIST EVAL & MGMT  07/03/2017    History reviewed. No pertinent family history.  Social History   Socioeconomic History  . Marital status: Married    Spouse name: Not on file  . Number of children: Not on file  . Years of education: Not on file  . Highest education level: Not on file  Occupational History  . Not on file  Tobacco Use  . Smoking status: Every Day    Current packs/day: 1.00    Types: Cigarettes  . Smokeless tobacco: Never  Substance and Sexual Activity  . Alcohol use: No  . Drug use: No  . Sexual activity: Not on file  Other Topics Concern  . Not on file  Social History Narrative  . Not on file   Social Determinants of Health   Financial Resource Strain: Medium Risk (08/05/2021)   Overall Financial Resource Strain (CARDIA)   . Difficulty of Paying Living Expenses: Somewhat hard  Food Insecurity: No Food Insecurity (08/05/2021)   Hunger Vital Sign   . Worried About Programme researcher, broadcasting/film/video in the Last Year: Never true   . Ran Out of Food in the Last Year: Never true  Transportation Needs: No Transportation Needs (08/05/2021)   PRAPARE - Transportation   . Lack of Transportation (Medical): No   . Lack of Transportation (Non-Medical): No  Physical Activity: Inactive (08/05/2021)    Exercise Vital Sign   . Days of Exercise per Week: 0 days   . Minutes of Exercise per Session: 0 min  Stress: No Stress Concern Present (08/05/2021)   [employer] of Occupational Health - Occupational Stress Questionnaire   . Feeling of Stress : Only a little  Social Connections: Moderately Isolated (08/05/2021)   Social Connection and Isolation Panel [NHANES]   . Frequency of Communication with Friends and Family: More than three times a week   . Frequency of Social Gatherings with Friends and Family: More than three times a week   . Attends Religious Services: Never   . Active Member of Clubs or Organizations: No   . Attends Banker Meetings: Never   . Marital Status: Married  Catering manager Violence: Not At Risk (08/05/2021)   Humiliation, Afraid, Rape, and Kick questionnaire   . Fear of Current or Ex-Partner: No   . Emotionally Abused: No   . Physically Abused: No   . Sexually Abused: No    Outpatient Medications Prior to Visit  Medication Sig Dispense Refill  . albuterol (VENTOLIN HFA) 108 (90 Base) MCG/ACT inhaler INHALE 2 PUFFS BY MOUTH EVERY 6 HOURS AS NEEDED FOR WHEEZING FOR SHORTNESS OF BREATH 9 g 0  . amLODipine (NORVASC) 2.5 MG tablet Take 1 tablet by mouth once daily 90 tablet 0  .  aspirin EC 325 MG tablet Take 325 mg by mouth daily as needed for mild pain or moderate pain.    Marland Kitchen atorvastatin (LIPITOR) 20 MG tablet Take 1 tablet (20 mg total) by mouth daily. 90 tablet 1  . carvedilol (COREG) 6.25 MG tablet Take 1 tablet (6.25 mg total) by mouth 2 (two) times daily with a meal. 180 tablet 1  . escitalopram (LEXAPRO) 20 MG tablet Take 1 tablet (20 mg total) by mouth daily. 60 tablet 1  . Misc. Devices MISC Blood pressure cuff - 1. 1 each 0  . telmisartan (MICARDIS) 40 MG tablet Take 1 tablet by mouth once daily 90 tablet 0  . Tiotropium Bromide Monohydrate (SPIRIVA RESPIMAT) 2.5 MCG/ACT AERS Inhale 2 puffs into the lungs daily. 4 g 1  . traZODone (DESYREL) 50  MG tablet Take 0.5-1 tablets (25-50 mg total) by mouth at bedtime as needed for sleep. 30 tablet 3   No facility-administered medications prior to visit.    Allergies  Allergen Reactions  . Cephalexin Hives and Rash    ROS Review of Systems    Objective:    Physical Exam  BP (!) 160/79   Pulse (!) 56   Ht 5\' 8"  (1.727 m)   Wt 212 lb 9.6 oz (96.4 kg)   SpO2 92%   BMI 32.33 kg/m  Wt Readings from Last 3 Encounters:  08/07/23 212 lb 9.6 oz (96.4 kg)  06/07/23 208 lb 9.6 oz (94.6 kg)  04/25/23 203 lb (92.1 kg)    Lab Results  Component Value Date   TSH 1.420 08/03/2022   Lab Results  Component Value Date   WBC 9.1 08/03/2022   HGB 15.9 08/03/2022   HCT 47.8 08/03/2022   MCV 94 08/03/2022   PLT 259 08/03/2022   Lab Results  Component Value Date   NA 137 08/03/2022   K 4.7 08/03/2022   CO2 25 08/03/2022   GLUCOSE 85 08/03/2022   BUN 9 08/03/2022   CREATININE 0.96 08/03/2022   BILITOT 0.4 08/03/2022   ALKPHOS 81 08/03/2022   AST 16 08/03/2022   ALT 8 08/03/2022   PROT 6.4 08/03/2022   ALBUMIN 4.2 08/03/2022   CALCIUM 9.3 08/03/2022   ANIONGAP 10 05/22/2017   EGFR 82 08/03/2022   Lab Results  Component Value Date   CHOL 197 08/03/2022   Lab Results  Component Value Date   HDL 30 (L) 08/03/2022   Lab Results  Component Value Date   LDLCALC 130 (H) 08/03/2022   Lab Results  Component Value Date   TRIG 207 (H) 08/03/2022   Lab Results  Component Value Date   CHOLHDL 6.6 (H) 08/03/2022   Lab Results  Component Value Date   HGBA1C 5.7 (H) 08/03/2022      Assessment & Plan:   Problem List Items Addressed This Visit       Other   Encounter for general adult medical examination with abnormal findings - Primary    No orders of the defined types were placed in this encounter.   Follow-up: No follow-ups on file.    Anabel Halon, MD

## 2023-08-07 NOTE — Patient Instructions (Signed)
Please start taking Telmisartan 80 mg once daily instead of 40 mg. Okay to take 2 tablets of 40 mg until you complete current supply.  Please continue to take medications as prescribed.  Please continue to follow low carb diet and perform moderate exercise/walking as tolerated.  Please get fasting blood tests done within a week.

## 2023-08-08 ENCOUNTER — Encounter: Payer: Self-pay | Admitting: Internal Medicine

## 2023-08-08 DIAGNOSIS — G4709 Other insomnia: Secondary | ICD-10-CM | POA: Insufficient documentation

## 2023-08-08 NOTE — Assessment & Plan Note (Signed)
Has wheezing and coughing spells Needs to quit smoking Continue Albuterol inhaler PRN for now, but does not use it frequently If persistent symptoms or frequent use of Albuterol required, will add maintenance inhaler

## 2023-08-08 NOTE — Assessment & Plan Note (Signed)
Well-controlled, needs to take Lexapro 20 mg QD regularly Was recently placed on trazodone for insomnia and anxiety spells during afternoon Followed by [facility] therapy/CCM

## 2023-08-08 NOTE — Assessment & Plan Note (Signed)
Improved with Trazodone

## 2023-08-08 NOTE — Assessment & Plan Note (Signed)
Continue Atorvastatin Check lipid profile

## 2023-08-08 NOTE — Assessment & Plan Note (Addendum)
BP Readings from Last 1 Encounters:  08/07/23 (!) 158/82   Uncontrolled with Telmisartan 40 mg QD, amlodipine 2.5 mg QD and Coreg 6.25 mg BID now Increased dose of telmisartan to 80 mg QD Had added Coreg considering his risk for CAD Counseled for compliance with the medications Advised DASH diet and moderate exercise/walking, at least 150 mins/wee Needs to quit smoking and be compliant to medications

## 2023-08-08 NOTE — Assessment & Plan Note (Signed)

## 2023-08-13 DIAGNOSIS — I10 Essential (primary) hypertension: Secondary | ICD-10-CM | POA: Diagnosis not present

## 2023-08-13 DIAGNOSIS — E559 Vitamin D deficiency, unspecified: Secondary | ICD-10-CM | POA: Diagnosis not present

## 2023-08-13 DIAGNOSIS — E782 Mixed hyperlipidemia: Secondary | ICD-10-CM | POA: Diagnosis not present

## 2023-08-13 DIAGNOSIS — F419 Anxiety disorder, unspecified: Secondary | ICD-10-CM | POA: Diagnosis not present

## 2023-08-13 DIAGNOSIS — Z125 Encounter for screening for malignant neoplasm of prostate: Secondary | ICD-10-CM | POA: Diagnosis not present

## 2023-08-13 DIAGNOSIS — R7303 Prediabetes: Secondary | ICD-10-CM | POA: Diagnosis not present

## 2023-08-17 ENCOUNTER — Ambulatory Visit: Payer: Medicare HMO | Admitting: Professional Counselor

## 2023-10-04 DIAGNOSIS — H26493 Other secondary cataract, bilateral: Secondary | ICD-10-CM | POA: Diagnosis not present

## 2023-10-04 DIAGNOSIS — H524 Presbyopia: Secondary | ICD-10-CM | POA: Diagnosis not present

## 2023-10-08 ENCOUNTER — Other Ambulatory Visit: Payer: Self-pay | Admitting: Internal Medicine

## 2023-10-08 DIAGNOSIS — J449 Chronic obstructive pulmonary disease, unspecified: Secondary | ICD-10-CM

## 2023-10-09 ENCOUNTER — Other Ambulatory Visit: Payer: Self-pay

## 2023-10-09 MED ORDER — SPIRIVA RESPIMAT 2.5 MCG/ACT IN AERS: 2.0000 | INHALATION_SPRAY | Freq: Every day | RESPIRATORY_TRACT | 1 refills | Status: DC

## 2023-10-11 ENCOUNTER — Encounter: Payer: Self-pay | Admitting: Internal Medicine

## 2023-10-11 ENCOUNTER — Ambulatory Visit (INDEPENDENT_AMBULATORY_CARE_PROVIDER_SITE_OTHER): Payer: Medicare HMO | Admitting: Internal Medicine

## 2023-10-11 VITALS — BP 136/70 | HR 61 | Ht 68.0 in | Wt 214.0 lb

## 2023-10-11 DIAGNOSIS — F419 Anxiety disorder, unspecified: Secondary | ICD-10-CM | POA: Diagnosis not present

## 2023-10-11 DIAGNOSIS — I10 Essential (primary) hypertension: Secondary | ICD-10-CM | POA: Diagnosis not present

## 2023-10-11 DIAGNOSIS — J449 Chronic obstructive pulmonary disease, unspecified: Secondary | ICD-10-CM | POA: Diagnosis not present

## 2023-10-11 DIAGNOSIS — Z720 Tobacco use: Secondary | ICD-10-CM | POA: Diagnosis not present

## 2023-10-11 DIAGNOSIS — M7989 Other specified soft tissue disorders: Secondary | ICD-10-CM | POA: Diagnosis not present

## 2023-10-11 DIAGNOSIS — F17210 Nicotine dependence, cigarettes, uncomplicated: Secondary | ICD-10-CM | POA: Diagnosis not present

## 2023-10-11 MED ORDER — HYDROXYZINE PAMOATE 25 MG PO CAPS
25.0000 mg | ORAL_CAPSULE | Freq: Two times a day (BID) | ORAL | 2 refills | Status: DC | PRN
Start: 2023-10-11 — End: 2024-06-16

## 2023-10-11 NOTE — Assessment & Plan Note (Addendum)
BP Readings from Last 1 Encounters:  10/11/23 (!) 156/87   Well-controlled with Telmisartan 80 mg QD, amlodipine 2.5 mg QD and Coreg 6.25 mg BID now Had added Coreg considering his risk for CAD Counseled for compliance with the medications Advised DASH diet and moderate exercise/walking, at least 150 mins/wee Needs to quit smoking and be compliant to medications

## 2023-10-11 NOTE — Assessment & Plan Note (Addendum)
Uncontrolled, needs to take Lexapro 20 mg QD regularly Was recently placed on trazodone for insomnia Added hide hydroxyzine 25 mg q12h PRN for anxiety spells Followed by [facility] therapy/CCM

## 2023-10-11 NOTE — Patient Instructions (Signed)
Please start taking Hydroxyzine as needed for anxiety.  Please continue to take medications as prescribed.  Please continue to follow low salt diet and ambulate as tolerated.

## 2023-10-11 NOTE — Progress Notes (Signed)
Established Patient Office Visit  Subjective:  Patient ID: Brett Elliott, male    DOB: 12-02-1945  Age: 77 y.o. MRN: [id number]  CC:  Chief Complaint  Patient presents with   Hypertension    Follow up    Leg Swelling    Leg and feet swelling    poor appetite    Poor appetite     HPI Brett Elliott is a 77 y.o. male with past medical history of hypertension, cerebral aneurysm s/p endovascular coiling and hyperlipidemia, anxiety, tobacco abuse, osteoarthritis and chronic genital wart who presents for f/u of her chronic medical conditions.  HTN: BP is well-controlled now. Takes medications regularly. Patient denies headache, dizziness, chest pain or palpitations.  He has chronic leg swelling, recently worse for the last 2 weeks.  Denies orthopnea or PND.  His wife reports that his anxiety is uncontrolled with Lexapro now.  He is having anxiety spells especially in the evenings and has insomnia as well.  He was placed on trazodone for insomnia after discussing with [facility] therapist. Denies any anhedonia, SI or HI.  He does not have severe anger spells now.  He still smokes about 0.5 pack/day.  He is not wheezing today.  He reports coughing spells with clear sputum at times.  Denies any hemoptysis.     Past Medical History:  Diagnosis Date   Anxiety    Brain aneurysm    Hypertension     Past Surgical History:  Procedure Laterality Date   BRAIN SURGERY     CATARACT EXTRACTION W/ INTRAOCULAR LENS IMPLANT Bilateral    IR RADIOLOGIST EVAL & MGMT  07/03/2017    History reviewed. No pertinent family history.  Social History   Socioeconomic History   Marital status: Married    Spouse name: Not on file   Number of children: Not on file   Years of education: Not on file   Highest education level: Not on file  Occupational History   Not on file  Tobacco Use   Smoking status: Every Day    Current packs/day: 1.00    Types: Cigarettes   Smokeless tobacco: Never  Substance and  Sexual Activity   Alcohol use: No   Drug use: No   Sexual activity: Not on file  Other Topics Concern   Not on file  Social History Narrative   Not on file   Social Determinants of Health   Financial Resource Strain: Medium Risk (08/05/2021)   Overall Financial Resource Strain (CARDIA)    Difficulty of Paying Living Expenses: Somewhat hard  Food Insecurity: No Food Insecurity (08/05/2021)   Hunger Vital Sign    Worried About Running Out of Food in the Last Year: Never true    Ran Out of Food in the Last Year: Never true  Transportation Needs: No Transportation Needs (08/05/2021)   PRAPARE - Administrator, Civil Service (Medical): No    Lack of Transportation (Non-Medical): No  Physical Activity: Inactive (08/05/2021)   Exercise Vital Sign    Days of Exercise per Week: 0 days    Minutes of Exercise per Session: 0 min  Stress: No Stress Concern Present (08/05/2021)   [employer] of Occupational Health - Occupational Stress Questionnaire    Feeling of Stress : Only a little  Social Connections: Moderately Isolated (08/05/2021)   Social Connection and Isolation Panel [NHANES]    Frequency of Communication with Friends and Family: More than three times a week    Frequency  of Social Gatherings with Friends and Family: More than three times a week    Attends Religious Services: Never    Database administrator or Organizations: No    Attends Banker Meetings: Never    Marital Status: Married  Catering manager Violence: Not At Risk (08/05/2021)   Humiliation, Afraid, Rape, and Kick questionnaire    Fear of Current or Ex-Partner: No    Emotionally Abused: No    Physically Abused: No    Sexually Abused: No    Outpatient Medications Prior to Visit  Medication Sig Dispense Refill   albuterol (VENTOLIN HFA) 108 (90 Base) MCG/ACT inhaler INHALE 2 PUFFS BY MOUTH EVERY 6 HOURS AS NEEDED FOR WHEEZING OR SHORTNESS OF BREATH 9 g 0   amLODipine (NORVASC) 2.5 MG tablet Take  1 tablet (2.5 mg total) by mouth daily. 90 tablet 1   aspirin EC 325 MG tablet Take 325 mg by mouth daily as needed for mild pain or moderate pain.     atorvastatin (LIPITOR) 20 MG tablet Take 1 tablet (20 mg total) by mouth daily. 90 tablet 1   carvedilol (COREG) 6.25 MG tablet Take 1 tablet (6.25 mg total) by mouth 2 (two) times daily with a meal. 180 tablet 1   escitalopram (LEXAPRO) 20 MG tablet Take 1 tablet (20 mg total) by mouth daily. 90 tablet 1   Misc. Devices MISC Blood pressure cuff - 1. 1 each 0   telmisartan (MICARDIS) 80 MG tablet Take 1 tablet (80 mg total) by mouth daily. 90 tablet 1   Tiotropium Bromide Monohydrate (SPIRIVA RESPIMAT) 2.5 MCG/ACT AERS Inhale 2 puffs into the lungs daily. 4 g 1   traZODone (DESYREL) 50 MG tablet Take 0.5-1 tablets (25-50 mg total) by mouth at bedtime as needed for sleep. 30 tablet 3   No facility-administered medications prior to visit.    Allergies  Allergen Reactions   Cephalexin Hives and Rash    ROS Review of Systems  Constitutional:  Negative for chills and fever.  HENT:  Negative for congestion and sore throat.   Eyes:  Negative for pain and discharge.  Respiratory:  Positive for cough. Negative for shortness of breath.   Cardiovascular:  Positive for leg swelling. Negative for chest pain and palpitations.  Gastrointestinal:  Negative for constipation, diarrhea, nausea and vomiting.  Endocrine: Negative for polydipsia and polyuria.  Genitourinary:  Negative for dysuria and hematuria.  Musculoskeletal:  Negative for neck pain and neck stiffness.  Skin:  Negative for rash.  Neurological:  Negative for dizziness, weakness and numbness.  Psychiatric/Behavioral:  Positive for sleep disturbance. Negative for agitation and behavioral problems. The patient is nervous/anxious.       Objective:    Physical Exam Vitals reviewed.  Constitutional:      General: He is not in acute distress.    Appearance: He is not diaphoretic.   HENT:     Head: Normocephalic and atraumatic.     Nose: Nose normal.     Mouth/Throat:     Mouth: Mucous membranes are moist.  Eyes:     General: No scleral icterus.    Extraocular Movements: Extraocular movements intact.  Cardiovascular:     Rate and Rhythm: Normal rate and regular rhythm.     Pulses: Normal pulses.     Heart sounds: Normal heart sounds. No murmur heard. Pulmonary:     Breath sounds: Normal breath sounds. No wheezing or rales.  Musculoskeletal:     Cervical back: Neck  supple. No tenderness.     Right lower leg: Edema (1+) present.     Left lower leg: Edema (1+) present.  Skin:    General: Skin is warm.     Findings: No rash.  Neurological:     General: No focal deficit present.     Mental Status: He is alert and oriented to person, place, and time.     Sensory: No sensory deficit.     Motor: No weakness.  Psychiatric:        Mood and Affect: Mood is anxious.        Behavior: Behavior normal.     BP 136/70 (BP Location: Left Arm)   Pulse 61   Ht 5\' 8"  (1.727 m)   Wt 214 lb (97.1 kg)   SpO2 90%   BMI 32.54 kg/m  Wt Readings from Last 3 Encounters:  10/11/23 214 lb (97.1 kg)  08/07/23 212 lb 9.6 oz (96.4 kg)  06/07/23 208 lb 9.6 oz (94.6 kg)    Lab Results  Component Value Date   TSH 1.950 08/13/2023   Lab Results  Component Value Date   WBC 9.6 08/13/2023   HGB 14.4 08/13/2023   HCT 44.5 08/13/2023   MCV 95 08/13/2023   PLT 286 08/13/2023   Lab Results  Component Value Date   NA 140 08/13/2023   K 3.9 08/13/2023   CO2 31 (H) 08/13/2023   GLUCOSE 80 08/13/2023   BUN 9 08/13/2023   CREATININE 1.07 08/13/2023   BILITOT <0.2 08/13/2023   ALKPHOS 88 08/13/2023   AST 21 08/13/2023   ALT 11 08/13/2023   PROT 6.0 08/13/2023   ALBUMIN 3.9 08/13/2023   CALCIUM 9.5 08/13/2023   ANIONGAP 10 05/22/2017   EGFR 71 08/13/2023   Lab Results  Component Value Date   CHOL 149 08/13/2023   Lab Results  Component Value Date   HDL 32 (L)  08/13/2023   Lab Results  Component Value Date   LDLCALC 77 08/13/2023   Lab Results  Component Value Date   TRIG 244 (H) 08/13/2023   Lab Results  Component Value Date   CHOLHDL 4.7 08/13/2023   Lab Results  Component Value Date   HGBA1C 5.9 (H) 08/13/2023      Assessment & Plan:   Problem List Items Addressed This Visit       Cardiovascular and Mediastinum   Hypertension - Primary    BP Readings from Last 1 Encounters:  10/11/23 (!) 156/87   Well-controlled with Telmisartan 80 mg QD, amlodipine 2.5 mg QD and Coreg 6.25 mg BID now Had added Coreg considering his risk for CAD Counseled for compliance with the medications Advised DASH diet and moderate exercise/walking, at least 150 mins/wee Needs to quit smoking and be compliant to medications        Respiratory   Chronic obstructive pulmonary disease (HCC)    Has wheezing and coughing spells Needs to quit smoking Continue Albuterol inhaler PRN for now, but does not use it frequently If persistent symptoms or frequent use of Albuterol required, will add maintenance inhaler        Other   Anxiety    Uncontrolled, needs to take Lexapro 20 mg QD regularly Was recently placed on trazodone for insomnia Added hide hydroxyzine 25 mg q12h PRN for anxiety spells Followed by [facility] therapy/CCM      Relevant Medications   hydrOXYzine (VISTARIL) 25 MG capsule   Leg swelling    Likely due to prolonged  standing and sitting in same position Advised leg elevation and compression socks If persistent, will add HCTZ        Meds ordered this encounter  Medications   hydrOXYzine (VISTARIL) 25 MG capsule    Sig: Take 1 capsule (25 mg total) by mouth every 12 (twelve) hours as needed for anxiety.    Dispense:  30 capsule    Refill:  2    Follow-up: Return in about 4 months (around 02/08/2024) for HTN and GAD.    Anabel Halon, MD

## 2023-10-12 DIAGNOSIS — M7989 Other specified soft tissue disorders: Secondary | ICD-10-CM | POA: Insufficient documentation

## 2023-10-12 NOTE — Assessment & Plan Note (Signed)
Has wheezing and coughing spells Needs to quit smoking Continue Albuterol inhaler PRN for now, but does not use it frequently If persistent symptoms or frequent use of Albuterol required, will add maintenance inhaler

## 2023-10-12 NOTE — Assessment & Plan Note (Signed)
Smokes about 0.5 pack/day  Asked about quitting: confirms that he currently smokes cigarettes Advise to quit smoking: Educated about QUITTING to reduce the risk of cancer, cardio and cerebrovascular disease. Assess willingness: Unwilling to quit at this time, but is working on cutting back. Assist with counseling and pharmacotherapy: Counseled for 5 minutes and literature provided. Nicotine patch prescribed. Arrange for follow up: follow up in 3 months and continue to offer help.

## 2023-10-12 NOTE — Assessment & Plan Note (Signed)
Likely due to prolonged standing and sitting in same position Advised leg elevation and compression socks If persistent, will add HCTZ

## 2023-10-16 ENCOUNTER — Telehealth: Payer: Self-pay

## 2023-10-16 NOTE — Telephone Encounter (Signed)
Copied from CRM [id number]. Topic: General - Other >> Oct 15, 2023  4:38 PM Tiffany H wrote: Reason for CRM: Patient's wife called to return message from [facility] counselor. Patient's wife advised that the patient has been left a few messages. Please assist.

## 2023-10-16 NOTE — Telephone Encounter (Signed)
Appointment already scheduled.

## 2023-10-19 ENCOUNTER — Ambulatory Visit: Payer: Medicare HMO | Admitting: Professional Counselor

## 2023-10-30 ENCOUNTER — Other Ambulatory Visit: Payer: Self-pay | Admitting: Internal Medicine

## 2023-10-30 DIAGNOSIS — J449 Chronic obstructive pulmonary disease, unspecified: Secondary | ICD-10-CM

## 2023-11-14 ENCOUNTER — Ambulatory Visit: Payer: Medicare HMO | Admitting: Internal Medicine

## 2023-12-14 ENCOUNTER — Telehealth: Payer: Self-pay | Admitting: Internal Medicine

## 2023-12-14 NOTE — Telephone Encounter (Signed)
LVM for pt to call back in regards to continue services with Brett Elliott

## 2024-01-23 ENCOUNTER — Other Ambulatory Visit: Payer: Self-pay | Admitting: Internal Medicine

## 2024-01-23 DIAGNOSIS — F419 Anxiety disorder, unspecified: Secondary | ICD-10-CM

## 2024-02-11 ENCOUNTER — Encounter: Payer: Self-pay | Admitting: Internal Medicine

## 2024-02-11 ENCOUNTER — Ambulatory Visit (INDEPENDENT_AMBULATORY_CARE_PROVIDER_SITE_OTHER): Payer: Medicare HMO | Admitting: Internal Medicine

## 2024-02-11 VITALS — BP 138/78 | HR 65 | Ht 68.0 in | Wt 224.6 lb

## 2024-02-11 DIAGNOSIS — G4709 Other insomnia: Secondary | ICD-10-CM

## 2024-02-11 DIAGNOSIS — J449 Chronic obstructive pulmonary disease, unspecified: Secondary | ICD-10-CM | POA: Diagnosis not present

## 2024-02-11 DIAGNOSIS — R7303 Prediabetes: Secondary | ICD-10-CM

## 2024-02-11 DIAGNOSIS — E782 Mixed hyperlipidemia: Secondary | ICD-10-CM | POA: Diagnosis not present

## 2024-02-11 DIAGNOSIS — Z720 Tobacco use: Secondary | ICD-10-CM | POA: Diagnosis not present

## 2024-02-11 DIAGNOSIS — I10 Essential (primary) hypertension: Secondary | ICD-10-CM | POA: Diagnosis not present

## 2024-02-11 DIAGNOSIS — F419 Anxiety disorder, unspecified: Secondary | ICD-10-CM | POA: Diagnosis not present

## 2024-02-11 MED ORDER — ALBUTEROL SULFATE HFA 108 (90 BASE) MCG/ACT IN AERS: 2.0000 | INHALATION_SPRAY | Freq: Four times a day (QID) | RESPIRATORY_TRACT | 2 refills | Status: DC | PRN

## 2024-02-11 MED ORDER — TELMISARTAN 80 MG PO TABS
80.0000 mg | ORAL_TABLET | Freq: Every day | ORAL | 1 refills | Status: AC
Start: 2024-02-11 — End: ?

## 2024-02-11 MED ORDER — AMLODIPINE BESYLATE 5 MG PO TABS
5.0000 mg | ORAL_TABLET | Freq: Every day | ORAL | 1 refills | Status: AC
Start: 2024-02-11 — End: ?

## 2024-02-11 MED ORDER — ATORVASTATIN CALCIUM 20 MG PO TABS
20.0000 mg | ORAL_TABLET | Freq: Every day | ORAL | 1 refills | Status: AC
Start: 2024-02-11 — End: ?

## 2024-02-11 MED ORDER — TRAZODONE HCL 50 MG PO TABS
50.0000 mg | ORAL_TABLET | Freq: Every evening | ORAL | 3 refills | Status: AC | PRN
Start: 2024-02-11 — End: ?

## 2024-02-11 MED ORDER — CARVEDILOL 6.25 MG PO TABS
6.2500 mg | ORAL_TABLET | Freq: Two times a day (BID) | ORAL | 1 refills | Status: AC
Start: 2024-02-11 — End: ?

## 2024-02-11 MED ORDER — BREZTRI AEROSPHERE 160-9-4.8 MCG/ACT IN AERO: 2.0000 | INHALATION_SPRAY | Freq: Two times a day (BID) | RESPIRATORY_TRACT | 11 refills | Status: DC

## 2024-02-11 NOTE — Assessment & Plan Note (Signed)
 BP Readings from Last 1 Encounters:  02/11/24 138/78   Well-controlled with Telmisartan 80 mg QD, amlodipine 2.5 mg QD and Coreg 6.25 mg BID now Had added Coreg considering his risk for CAD Counseled for compliance with the medications Advised DASH diet and moderate exercise/walking, at least 150 mins/wee Needs to quit smoking and be compliant to medications

## 2024-02-11 NOTE — Progress Notes (Unsigned)
 Established Patient Office Visit  Subjective:  Patient ID: Brett Elliott, male    DOB: 1946/09/23  Age: 78 y.o. MRN: [id number]  CC:  Chief Complaint  Patient presents with   Care Management    Follow up    HPI Brett Elliott is a 78 y.o. male with past medical history of hypertension, cerebral aneurysm s/p endovascular coiling and hyperlipidemia, anxiety, tobacco abuse, osteoarthritis and chronic genital wart who presents for f/u of her chronic medical conditions.  HTN: BP is well-controlled now. Takes medications regularly. Patient denies headache, dizziness, chest pain or palpitations.  He has chronic leg swelling, recently worse for the last 2 weeks.  Denies orthopnea or PND.  His wife reports that his anxiety is uncontrolled with Lexapro now.  He is having anxiety spells especially in the evenings and has insomnia as well.  He was placed on trazodone for insomnia after discussing with [facility] therapist. Denies any anhedonia, SI or HI.  He does not have severe anger spells now.  He still smokes about 0.5 pack/day.  He is not wheezing today.  He reports coughing spells with clear sputum at times.  Denies any hemoptysis.     Past Medical History:  Diagnosis Date   Anxiety    Brain aneurysm    Hypertension     Past Surgical History:  Procedure Laterality Date   BRAIN SURGERY     CATARACT EXTRACTION W/ INTRAOCULAR LENS IMPLANT Bilateral    IR RADIOLOGIST EVAL & MGMT  07/03/2017    History reviewed. No pertinent family history.  Social History   Socioeconomic History   Marital status: Married    Spouse name: Not on file   Number of children: Not on file   Years of education: Not on file   Highest education level: Not on file  Occupational History   Not on file  Tobacco Use   Smoking status: Every Day    Current packs/day: 1.00    Types: Cigarettes   Smokeless tobacco: Never  Substance and Sexual Activity   Alcohol use: No   Drug use: No   Sexual activity: Not on  file  Other Topics Concern   Not on file  Social History Narrative   Not on file   Social Drivers of Health   Financial Resource Strain: Medium Risk (08/05/2021)   Overall Financial Resource Strain (CARDIA)    Difficulty of Paying Living Expenses: Somewhat hard  Food Insecurity: No Food Insecurity (08/05/2021)   Hunger Vital Sign    Worried About Running Out of Food in the Last Year: Never true    Ran Out of Food in the Last Year: Never true  Transportation Needs: No Transportation Needs (08/05/2021)   PRAPARE - Administrator, Civil Service (Medical): No    Lack of Transportation (Non-Medical): No  Physical Activity: Inactive (08/05/2021)   Exercise Vital Sign    Days of Exercise per Week: 0 days    Minutes of Exercise per Session: 0 min  Stress: No Stress Concern Present (08/05/2021)   [employer] of Occupational Health - Occupational Stress Questionnaire    Feeling of Stress : Only a little  Social Connections: Moderately Isolated (08/05/2021)   Social Connection and Isolation Panel [NHANES]    Frequency of Communication with Friends and Family: More than three times a week    Frequency of Social Gatherings with Friends and Family: More than three times a week    Attends Religious Services: Never  Active Member of Clubs or Organizations: No    Attends Banker Meetings: Never    Marital Status: Married  Catering manager Violence: Not At Risk (08/05/2021)   Humiliation, Afraid, Rape, and Kick questionnaire    Fear of Current or Ex-Partner: No    Emotionally Abused: No    Physically Abused: No    Sexually Abused: No    Outpatient Medications Prior to Visit  Medication Sig Dispense Refill   albuterol (VENTOLIN HFA) 108 (90 Base) MCG/ACT inhaler INHALE 2 PUFFS BY MOUTH EVERY 6 HOURS AS NEEDED FOR WHEEZING FOR SHORTNESS OF BREATH 9 g 0   amLODipine (NORVASC) 2.5 MG tablet Take 1 tablet (2.5 mg total) by mouth daily. 90 tablet 1   aspirin EC 325 MG tablet  Take 325 mg by mouth daily as needed for mild pain or moderate pain.     atorvastatin (LIPITOR) 20 MG tablet Take 1 tablet (20 mg total) by mouth daily. 90 tablet 1   carvedilol (COREG) 6.25 MG tablet Take 1 tablet (6.25 mg total) by mouth 2 (two) times daily with a meal. 180 tablet 1   escitalopram (LEXAPRO) 20 MG tablet Take 1 tablet by mouth once daily 90 tablet 0   hydrOXYzine (VISTARIL) 25 MG capsule Take 1 capsule (25 mg total) by mouth every 12 (twelve) hours as needed for anxiety. 30 capsule 2   Misc. Devices MISC Blood pressure cuff - 1. 1 each 0   telmisartan (MICARDIS) 80 MG tablet Take 1 tablet (80 mg total) by mouth daily. 90 tablet 1   Tiotropium Bromide Monohydrate (SPIRIVA RESPIMAT) 2.5 MCG/ACT AERS Inhale 2 puffs into the lungs daily. 4 g 1   traZODone (DESYREL) 50 MG tablet Take 0.5-1 tablets (25-50 mg total) by mouth at bedtime as needed for sleep. 30 tablet 3   No facility-administered medications prior to visit.    Allergies  Allergen Reactions   Cephalexin Hives and Rash    ROS Review of Systems  Constitutional:  Negative for chills and fever.  HENT:  Negative for congestion and sore throat.   Eyes:  Negative for pain and discharge.  Respiratory:  Positive for cough. Negative for shortness of breath.   Cardiovascular:  Positive for leg swelling. Negative for chest pain and palpitations.  Gastrointestinal:  Negative for constipation, diarrhea, nausea and vomiting.  Endocrine: Negative for polydipsia and polyuria.  Genitourinary:  Negative for dysuria and hematuria.  Musculoskeletal:  Negative for neck pain and neck stiffness.  Skin:  Negative for rash.  Neurological:  Negative for dizziness, weakness and numbness.  Psychiatric/Behavioral:  Positive for sleep disturbance. Negative for agitation and behavioral problems. The patient is nervous/anxious.       Objective:    Physical Exam Vitals reviewed.  Constitutional:      General: He is not in acute  distress.    Appearance: He is not diaphoretic.  HENT:     Head: Normocephalic and atraumatic.     Nose: Nose normal.     Mouth/Throat:     Mouth: Mucous membranes are moist.  Eyes:     General: No scleral icterus.    Extraocular Movements: Extraocular movements intact.  Cardiovascular:     Rate and Rhythm: Normal rate and regular rhythm.     Pulses: Normal pulses.     Heart sounds: Normal heart sounds. No murmur heard. Pulmonary:     Breath sounds: Normal breath sounds. No wheezing or rales.  Musculoskeletal:     Cervical back:  Neck supple. No tenderness.     Right lower leg: Edema (1+) present.     Left lower leg: Edema (1+) present.  Skin:    General: Skin is warm.     Findings: No rash.  Neurological:     General: No focal deficit present.     Mental Status: He is alert and oriented to person, place, and time.     Sensory: No sensory deficit.     Motor: No weakness.  Psychiatric:        Mood and Affect: Mood is anxious.        Behavior: Behavior normal.     BP 138/78 (BP Location: Left Arm)   Pulse 65   Ht 5\' 8"  (1.727 m)   Wt 224 lb 9.6 oz (101.9 kg)   SpO2 90%   BMI 34.15 kg/m  Wt Readings from Last 3 Encounters:  02/11/24 224 lb 9.6 oz (101.9 kg)  10/11/23 214 lb (97.1 kg)  08/07/23 212 lb 9.6 oz (96.4 kg)    Lab Results  Component Value Date   TSH 1.950 08/13/2023   Lab Results  Component Value Date   WBC 9.6 08/13/2023   HGB 14.4 08/13/2023   HCT 44.5 08/13/2023   MCV 95 08/13/2023   PLT 286 08/13/2023   Lab Results  Component Value Date   NA 140 08/13/2023   K 3.9 08/13/2023   CO2 31 (H) 08/13/2023   GLUCOSE 80 08/13/2023   BUN 9 08/13/2023   CREATININE 1.07 08/13/2023   BILITOT <0.2 08/13/2023   ALKPHOS 88 08/13/2023   AST 21 08/13/2023   ALT 11 08/13/2023   PROT 6.0 08/13/2023   ALBUMIN 3.9 08/13/2023   CALCIUM 9.5 08/13/2023   ANIONGAP 10 05/22/2017   EGFR 71 08/13/2023   Lab Results  Component Value Date   CHOL 149  08/13/2023   Lab Results  Component Value Date   HDL 32 (L) 08/13/2023   Lab Results  Component Value Date   LDLCALC 77 08/13/2023   Lab Results  Component Value Date   TRIG 244 (H) 08/13/2023   Lab Results  Component Value Date   CHOLHDL 4.7 08/13/2023   Lab Results  Component Value Date   HGBA1C 5.9 (H) 08/13/2023      Assessment & Plan:   Problem List Items Addressed This Visit   None     No orders of the defined types were placed in this encounter.   Follow-up: No follow-ups on file.    Anabel Halon, MD

## 2024-02-11 NOTE — Patient Instructions (Signed)
 Please start taking Amlodipine 5 mg instead of 2.5 mg.  Please start using Breztri as prescribed instead of Spiriva.  Please continue to take other medications as prescribed.  Please continue to follow low salt diet and perform moderate exercise/walking at least 150 mins/week.

## 2024-02-11 NOTE — Assessment & Plan Note (Signed)
 Well-controlled, needs to take Lexapro 20 mg QD regularly On trazodone for insomnia Has hydroxyzine 25 mg q12h PRN for anxiety spells Followed by [facility] therapy/CCM

## 2024-02-12 LAB — LIPID PANEL
Chol/HDL Ratio: 4.2 ratio (ref 0.0–5.0)
Cholesterol, Total: 139 mg/dL (ref 100–199)
HDL: 33 mg/dL — ABNORMAL LOW (ref >39)
LDL Chol Calc (NIH): 70 mg/dL (ref 0–99)
Triglycerides: 217 mg/dL — ABNORMAL HIGH (ref 0–149)
VLDL Cholesterol Cal: 36 mg/dL (ref 5–40)

## 2024-02-12 LAB — CMP14+EGFR
ALT: 13 IU/L (ref 0–44)
AST: 18 IU/L (ref 0–40)
Albumin: 4 g/dL (ref 3.8–4.8)
Alkaline Phosphatase: 98 IU/L (ref 44–121)
BUN/Creatinine Ratio: 13 (ref 10–24)
BUN: 11 mg/dL (ref 8–27)
Bilirubin Total: <0.2 mg/dL (ref 0.0–1.2)
CO2: 29 mmol/L (ref 20–29)
Calcium: 9.4 mg/dL (ref 8.6–10.2)
Chloride: 95 mmol/L — ABNORMAL LOW (ref 96–106)
Creatinine, Ser: 0.84 mg/dL (ref 0.76–1.27)
Globulin, Total: 2.3 g/dL (ref 1.5–4.5)
Glucose: 86 mg/dL (ref 70–99)
Potassium: 4.6 mmol/L (ref 3.5–5.2)
Sodium: 136 mmol/L (ref 134–144)
Total Protein: 6.3 g/dL (ref 6.0–8.5)
eGFR: 90 mL/min/{1.73_m2} (ref >59)

## 2024-02-12 LAB — HEMOGLOBIN A1C
Est. average glucose Bld gHb Est-mCnc: 126 mg/dL
Hgb A1c MFr Bld: 6 % — ABNORMAL HIGH (ref 4.8–5.6)

## 2024-02-15 DIAGNOSIS — R7303 Prediabetes: Secondary | ICD-10-CM | POA: Insufficient documentation

## 2024-02-15 NOTE — Assessment & Plan Note (Signed)
 Has wheezing and coughing spells Needs to quit smoking DC Spiriva, added Breztri as maintenance treatment Continue Albuterol inhaler PRN for now

## 2024-02-15 NOTE — Assessment & Plan Note (Signed)
 Overall improved with Trazodone, but still has difficulty initiating sleep If persistent concern, can switch to Seroquel

## 2024-02-15 NOTE — Assessment & Plan Note (Signed)
Continue Atorvastatin Check lipid profile

## 2024-02-15 NOTE — Assessment & Plan Note (Signed)
Smokes about 0.5 pack/day  Asked about quitting: confirms that he currently smokes cigarettes Advise to quit smoking: Educated about QUITTING to reduce the risk of cancer, cardio and cerebrovascular disease. Assess willingness: Unwilling to quit at this time, but is working on cutting back. Assist with counseling and pharmacotherapy: Counseled for 5 minutes and literature provided. Nicotine patch prescribed. Arrange for follow up: follow up in 3 months and continue to offer help.

## 2024-02-15 NOTE — Assessment & Plan Note (Addendum)
 Lab Results  Component Value Date   HGBA1C 6.0 (H) 02/11/2024   Advised to follow DASH diet

## 2024-03-28 ENCOUNTER — Other Ambulatory Visit: Payer: Self-pay | Admitting: Internal Medicine

## 2024-03-28 DIAGNOSIS — F419 Anxiety disorder, unspecified: Secondary | ICD-10-CM

## 2024-05-14 ENCOUNTER — Ambulatory Visit (INDEPENDENT_AMBULATORY_CARE_PROVIDER_SITE_OTHER): Admitting: Internal Medicine

## 2024-05-14 ENCOUNTER — Encounter: Payer: Self-pay | Admitting: Internal Medicine

## 2024-05-14 VITALS — BP 117/67 | HR 52 | Ht 68.0 in | Wt 222.4 lb

## 2024-05-14 DIAGNOSIS — F331 Major depressive disorder, recurrent, moderate: Secondary | ICD-10-CM

## 2024-05-14 DIAGNOSIS — I10 Essential (primary) hypertension: Secondary | ICD-10-CM

## 2024-05-14 DIAGNOSIS — J449 Chronic obstructive pulmonary disease, unspecified: Secondary | ICD-10-CM

## 2024-05-14 DIAGNOSIS — Z720 Tobacco use: Secondary | ICD-10-CM

## 2024-05-14 DIAGNOSIS — G4709 Other insomnia: Secondary | ICD-10-CM | POA: Diagnosis not present

## 2024-05-14 DIAGNOSIS — F419 Anxiety disorder, unspecified: Secondary | ICD-10-CM | POA: Diagnosis not present

## 2024-05-14 DIAGNOSIS — R7303 Prediabetes: Secondary | ICD-10-CM

## 2024-05-14 DIAGNOSIS — E782 Mixed hyperlipidemia: Secondary | ICD-10-CM | POA: Diagnosis not present

## 2024-05-14 MED ORDER — TELMISARTAN 80 MG PO TABS: 80.0000 mg | ORAL_TABLET | Freq: Every day | ORAL | 3 refills | Status: AC

## 2024-05-14 MED ORDER — BUPROPION HCL ER (XL) 150 MG PO TB24: 150.0000 mg | ORAL_TABLET | Freq: Every day | ORAL | 1 refills | Status: DC

## 2024-05-14 MED ORDER — TRAZODONE HCL 50 MG PO TABS
50.0000 mg | ORAL_TABLET | Freq: Every evening | ORAL | 3 refills | Status: DC | PRN
Start: 2024-05-14 — End: 2024-08-14

## 2024-05-14 MED ORDER — AMLODIPINE BESYLATE 5 MG PO TABS
5.0000 mg | ORAL_TABLET | Freq: Every day | ORAL | 3 refills | Status: DC
Start: 2024-05-14 — End: 2024-07-07

## 2024-05-14 MED ORDER — CARVEDILOL 6.25 MG PO TABS: 6.2500 mg | ORAL_TABLET | Freq: Two times a day (BID) | ORAL | 3 refills | Status: DC

## 2024-05-14 MED ORDER — ATORVASTATIN CALCIUM 20 MG PO TABS: 20.0000 mg | ORAL_TABLET | Freq: Every day | ORAL | 3 refills | Status: AC

## 2024-05-14 NOTE — Assessment & Plan Note (Signed)
 Smokes about 0.5 pack/day  Asked about quitting: confirms that he currently smokes cigarettes Advise to quit smoking: Educated about QUITTING to reduce the risk of cancer, cardio and cerebrovascular disease. Assess willingness: Unwilling to quit at this time, but is working on cutting back. Assist with counseling and pharmacotherapy: Counseled for 5 minutes and literature provided.  Wellbutrin prescribed. Arrange for follow up: follow up in 3 months and continue to offer help.

## 2024-05-14 NOTE — Assessment & Plan Note (Addendum)
 Uncontrolled, takes Lexapro  20 mg QD regularly On trazodone  for insomnia Has hydroxyzine  25 mg q12h PRN for anxiety spells Switched from Lexapro  to Wellbutrin 150 mg QD Used to be followed by [facility] therapy/CCM - needs follow up

## 2024-05-14 NOTE — Patient Instructions (Addendum)
 Schedule your Medicare Annual Wellness Visit at checkout.  Please start taking Wellbutrin as prescribed. Please stop taking Lexapro .  Please continue to take medications as prescribed.  Please continue to follow low salt diet and perform moderate exercise/walking as tolerated.

## 2024-05-14 NOTE — Assessment & Plan Note (Signed)
 BP Readings from Last 1 Encounters:  05/14/24 117/67   Well-controlled with Telmisartan  80 mg QD, amlodipine  5 mg QD and Coreg  6.25 mg BID now Had added Coreg  considering his risk for CAD Counseled for compliance with the medications Advised DASH diet and moderate exercise/walking, at least 150 mins/wee Needs to quit smoking and be compliant to medications

## 2024-05-14 NOTE — Assessment & Plan Note (Signed)
 Lab Results  Component Value Date   HGBA1C 6.0 (H) 02/11/2024   Advised to follow DASH diet

## 2024-05-14 NOTE — Assessment & Plan Note (Addendum)
 Continue Atorvastatin  Check lipid profile in the next visit

## 2024-05-14 NOTE — Progress Notes (Signed)
 Established Patient Office Visit  Subjective:  Patient ID: Brett Elliott, male    DOB: 08-May-1946  Age: 78 y.o. MRN: [id number]  CC:  Chief Complaint  Patient presents with   Medical Management of Chronic Issues    3 month f/u, pt reports sleeping lots throughout the day.     HPI Brett Elliott is a 78 y.o. male with past medical history of hypertension, cerebral aneurysm s/p endovascular coiling and hyperlipidemia, anxiety, tobacco abuse, osteoarthritis and chronic genital wart who presents for f/u of her chronic medical conditions.  HTN: BP is wnl now. Takes medications regularly. Patient denies headache, dizziness, chest pain or palpitations.  He has chronic leg swelling, usually gets better with leg elevation.  Denies orthopnea or PND.  His wife reports that his anxiety is still uncontrolled with Lexapro  now.  He is having anxiety spells especially in the evenings and has insomnia as well. He has felt better with Hydroxyzine  overall. He was placed on trazodone  for insomnia after discussing with [facility] therapist, but does not take it regularly.  His wife reports that he sleeps throughout the day, does not get out of his bedroom and does not like to do his daily activities as well.  Denies any SI or HI.  He does not have severe anger spells now.  He still smokes about 0.5 pack/day.  He is not wheezing today.  He reports coughing spells with clear sputum at times. He has used Spiriva , but still has chronic cough and dyspnea. Denies any hemoptysis.  He was prescribed Breztri  in the last visit, but has not picked it up yet.     Past Medical History:  Diagnosis Date   Anxiety    Brain aneurysm    Hypertension     Past Surgical History:  Procedure Laterality Date   BRAIN SURGERY     CATARACT EXTRACTION W/ INTRAOCULAR LENS IMPLANT Bilateral    IR RADIOLOGIST EVAL & MGMT  07/03/2017    History reviewed. No pertinent family history.  Social History   Socioeconomic History    Marital status: Married    Spouse name: Not on file   Number of children: Not on file   Years of education: Not on file   Highest education level: Not on file  Occupational History   Not on file  Tobacco Use   Smoking status: Every Day    Current packs/day: 1.00    Types: Cigarettes   Smokeless tobacco: Never  Substance and Sexual Activity   Alcohol use: No   Drug use: No   Sexual activity: Not on file  Other Topics Concern   Not on file  Social History Narrative   Not on file   Social Drivers of Health   Financial Resource Strain: Medium Risk (08/05/2021)   Overall Financial Resource Strain (CARDIA)    Difficulty of Paying Living Expenses: Somewhat hard  Food Insecurity: No Food Insecurity (08/05/2021)   Hunger Vital Sign    Worried About Running Out of Food in the Last Year: Never true    Ran Out of Food in the Last Year: Never true  Transportation Needs: No Transportation Needs (08/05/2021)   PRAPARE - Administrator, Civil Service (Medical): No    Lack of Transportation (Non-Medical): No  Physical Activity: Inactive (08/05/2021)   Exercise Vital Sign    Days of Exercise per Week: 0 days    Minutes of Exercise per Session: 0 min  Stress: No Stress Concern Present (  08/05/2021)   [employer] - Occupational Stress Questionnaire    Feeling of Stress : Only a little  Social Connections: Moderately Isolated (08/05/2021)   Social Connection and Isolation Panel    Frequency of Communication with Friends and Family: More than three times a week    Frequency of Social Gatherings with Friends and Family: More than three times a week    Attends Religious Services: Never    Database administrator or Organizations: No    Attends Banker Meetings: Never    Marital Status: Married  Catering manager Violence: Not At Risk (08/05/2021)   Humiliation, Afraid, Rape, and Kick questionnaire    Fear of Current or Ex-Partner: No    Emotionally  Abused: No    Physically Abused: No    Sexually Abused: No    Outpatient Medications Prior to Visit  Medication Sig Dispense Refill   albuterol  (VENTOLIN  HFA) 108 (90 Base) MCG/ACT inhaler Inhale 2 puffs into the lungs every 6 (six) hours as needed for wheezing or shortness of breath. 18 g 2   amLODipine  (NORVASC ) 5 MG tablet Take 1 tablet (5 mg total) by mouth daily. 90 tablet 1   aspirin  EC 325 MG tablet Take 325 mg by mouth daily as needed for mild pain or moderate pain.     atorvastatin  (LIPITOR) 20 MG tablet Take 1 tablet (20 mg total) by mouth daily. 90 tablet 1   budeson-glycopyrrolate-formoterol (BREZTRI  AEROSPHERE) 160-9-4.8 MCG/ACT AERO Inhale 2 puffs into the lungs 2 (two) times daily. 10.7 g 11   carvedilol  (COREG ) 6.25 MG tablet Take 1 tablet (6.25 mg total) by mouth 2 (two) times daily with a meal. 180 tablet 1   hydrOXYzine  (VISTARIL ) 25 MG capsule Take 1 capsule (25 mg total) by mouth every 12 (twelve) hours as needed for anxiety. 30 capsule 2   Misc. Devices MISC Blood pressure cuff - 1. 1 each 0   telmisartan  (MICARDIS ) 80 MG tablet Take 1 tablet (80 mg total) by mouth daily. 90 tablet 1   escitalopram  (LEXAPRO ) 20 MG tablet Take 1 tablet by mouth once daily 90 tablet 0   traZODone  (DESYREL ) 50 MG tablet Take 1 tablet (50 mg total) by mouth at bedtime as needed for sleep. 30 tablet 3   No facility-administered medications prior to visit.    Allergies  Allergen Reactions   Cephalexin Hives and Rash    ROS Review of Systems  Constitutional:  Negative for chills and fever.  HENT:  Negative for congestion and sore throat.   Eyes:  Negative for pain and discharge.  Respiratory:  Positive for cough and shortness of breath (intermittent).   Cardiovascular:  Positive for leg swelling. Negative for chest pain and palpitations.  Gastrointestinal:  Negative for diarrhea, nausea and vomiting.  Endocrine: Negative for polydipsia and polyuria.  Genitourinary:  Negative for  dysuria and hematuria.  Musculoskeletal:  Negative for neck pain and neck stiffness.  Skin:  Negative for rash.  Neurological:  Negative for dizziness, weakness and numbness.  Psychiatric/Behavioral:  Positive for dysphoric mood and sleep disturbance. Negative for agitation and behavioral problems. The patient is nervous/anxious.       Objective:    Physical Exam Vitals reviewed.  Constitutional:      General: He is not in acute distress.    Appearance: He is not diaphoretic.  HENT:     Head: Normocephalic and atraumatic.     Nose: Nose normal.  Mouth/Throat:     Mouth: Mucous membranes are moist.   Eyes:     General: No scleral icterus.    Extraocular Movements: Extraocular movements intact.    Cardiovascular:     Rate and Rhythm: Normal rate and regular rhythm.     Heart sounds: Normal heart sounds. No murmur heard. Pulmonary:     Breath sounds: Normal breath sounds. No wheezing or rales.   Musculoskeletal:     Cervical back: Neck supple. No tenderness.     Right lower leg: Edema (1+) present.     Left lower leg: Edema (1+) present.   Skin:    General: Skin is warm.     Findings: No rash.   Neurological:     General: No focal deficit present.     Mental Status: He is alert and oriented to person, place, and time.     Sensory: No sensory deficit.     Motor: No weakness.   Psychiatric:        Mood and Affect: Mood is anxious.        Behavior: Behavior normal.     BP 117/67   Pulse (!) 52   Ht 5' 8 (1.727 m)   Wt 222 lb 6.4 oz (100.9 kg)   SpO2 90%   BMI 33.82 kg/m  Wt Readings from Last 3 Encounters:  05/14/24 222 lb 6.4 oz (100.9 kg)  02/11/24 224 lb 9.6 oz (101.9 kg)  10/11/23 214 lb (97.1 kg)    Lab Results  Component Value Date   TSH 1.950 08/13/2023   Lab Results  Component Value Date   WBC 9.6 08/13/2023   HGB 14.4 08/13/2023   HCT 44.5 08/13/2023   MCV 95 08/13/2023   PLT 286 08/13/2023   Lab Results  Component Value Date   NA  136 02/11/2024   K 4.6 02/11/2024   CO2 29 02/11/2024   GLUCOSE 86 02/11/2024   BUN 11 02/11/2024   CREATININE 0.84 02/11/2024   BILITOT <0.2 02/11/2024   ALKPHOS 98 02/11/2024   AST 18 02/11/2024   ALT 13 02/11/2024   PROT 6.3 02/11/2024   ALBUMIN 4.0 02/11/2024   CALCIUM  9.4 02/11/2024   ANIONGAP 10 05/22/2017   EGFR 90 02/11/2024   Lab Results  Component Value Date   CHOL 139 02/11/2024   Lab Results  Component Value Date   HDL 33 (L) 02/11/2024   Lab Results  Component Value Date   LDLCALC 70 02/11/2024   Lab Results  Component Value Date   TRIG 217 (H) 02/11/2024   Lab Results  Component Value Date   CHOLHDL 4.2 02/11/2024   Lab Results  Component Value Date   HGBA1C 6.0 (H) 02/11/2024      Assessment & Plan:   Problem List Items Addressed This Visit       Cardiovascular and Mediastinum   Hypertension - Primary   BP Readings from Last 1 Encounters:  05/14/24 117/67   Well-controlled with Telmisartan  80 mg QD, amlodipine  5 mg QD and Coreg  6.25 mg BID now Had added Coreg  considering his risk for CAD Counseled for compliance with the medications Advised DASH diet and moderate exercise/walking, at least 150 mins/wee Needs to quit smoking and be compliant to medications        Respiratory   Chronic obstructive pulmonary disease (HCC)   Has wheezing and coughing spells Needs to quit smoking DC Spiriva , added Breztri  as maintenance treatment - needs to start using it regularly Continue  Albuterol  inhaler PRN for now        Other   Hyperlipidemia   Continue Atorvastatin  Check lipid profile in the next visit      MDD (major depressive disorder), recurrent episode (HCC)   Uncontrolled, due to noncompliance versus domestic stressors On Lexapro  20 mg QD currently, DC Lexapro  Start Wellbutrin 150 mg QD, can also help with tobacco cessation Continue hydroxyzine  and trazodone  for anxiety and insomnia      Relevant Medications   buPROPion  (WELLBUTRIN XL) 150 MG 24 hr tablet   traZODone  (DESYREL ) 50 MG tablet   Tobacco abuse   Smokes about 0.5 pack/day  Asked about quitting: confirms that he currently smokes cigarettes Advise to quit smoking: Educated about QUITTING to reduce the risk of cancer, cardio and cerebrovascular disease. Assess willingness: Unwilling to quit at this time, but is working on cutting back. Assist with counseling and pharmacotherapy: Counseled for 5 minutes and literature provided.  Wellbutrin prescribed. Arrange for follow up: follow up in 3 months and continue to offer help.      Anxiety   Uncontrolled, takes Lexapro  20 mg QD regularly On trazodone  for insomnia Has hydroxyzine  25 mg q12h PRN for anxiety spells Switched from Lexapro  to Wellbutrin 150 mg QD Used to be followed by [facility] therapy/CCM - needs follow up      Relevant Medications   buPROPion (WELLBUTRIN XL) 150 MG 24 hr tablet   traZODone  (DESYREL ) 50 MG tablet   Insomnia   Relevant Medications   traZODone  (DESYREL ) 50 MG tablet   Prediabetes   Lab Results  Component Value Date   HGBA1C 6.0 (H) 02/11/2024   Advised to follow DASH diet         Meds ordered this encounter  Medications   buPROPion (WELLBUTRIN XL) 150 MG 24 hr tablet    Sig: Take 1 tablet (150 mg total) by mouth daily.    Dispense:  90 tablet    Refill:  1   traZODone  (DESYREL ) 50 MG tablet    Sig: Take 1 tablet (50 mg total) by mouth at bedtime as needed for sleep.    Dispense:  30 tablet    Refill:  3    Follow-up: Return in about 3 months (around 08/14/2024) for HTN and MDD.    Meldon Sport, MD

## 2024-05-14 NOTE — Assessment & Plan Note (Signed)
 Uncontrolled, due to noncompliance versus domestic stressors On Lexapro  20 mg QD currently, DC Lexapro  Start Wellbutrin 150 mg QD, can also help with tobacco cessation Continue hydroxyzine  and trazodone  for anxiety and insomnia

## 2024-05-14 NOTE — Addendum Note (Signed)
 Addended byCleola Dach on: 05/14/2024 05:10 PM   Modules accepted: Orders

## 2024-05-14 NOTE — Assessment & Plan Note (Signed)
 Has wheezing and coughing spells Needs to quit smoking DC Spiriva , added Breztri  as maintenance treatment - needs to start using it regularly Continue Albuterol  inhaler PRN for now

## 2024-05-24 ENCOUNTER — Encounter (HOSPITAL_COMMUNITY): Payer: Self-pay | Admitting: Interventional Radiology

## 2024-06-13 ENCOUNTER — Other Ambulatory Visit: Payer: Self-pay | Admitting: Internal Medicine

## 2024-06-13 DIAGNOSIS — F419 Anxiety disorder, unspecified: Secondary | ICD-10-CM

## 2024-06-20 ENCOUNTER — Ambulatory Visit (HOSPITAL_COMMUNITY)
Admission: RE | Admit: 2024-06-20 | Discharge: 2024-06-20 | Disposition: A | Discharge status: Home / Self Care | Source: Ambulatory Visit | Attending: Family Medicine | Admitting: Family Medicine

## 2024-06-20 ENCOUNTER — Ambulatory Visit: Admitting: Family Medicine

## 2024-06-20 ENCOUNTER — Encounter: Payer: Self-pay | Admitting: Family Medicine

## 2024-06-20 VITALS — BP 135/73 | HR 70 | Resp 16 | Ht 68.0 in | Wt 220.0 lb

## 2024-06-20 DIAGNOSIS — W19XXXA Unspecified fall, initial encounter: Secondary | ICD-10-CM

## 2024-06-20 DIAGNOSIS — S301XXA Contusion of abdominal wall, initial encounter: Secondary | ICD-10-CM | POA: Insufficient documentation

## 2024-06-20 DIAGNOSIS — R6889 Other general symptoms and signs: Secondary | ICD-10-CM | POA: Diagnosis not present

## 2024-06-20 DIAGNOSIS — R1012 Left upper quadrant pain: Secondary | ICD-10-CM | POA: Diagnosis not present

## 2024-06-20 DIAGNOSIS — J449 Chronic obstructive pulmonary disease, unspecified: Secondary | ICD-10-CM | POA: Diagnosis not present

## 2024-06-20 DIAGNOSIS — R152 Fecal urgency: Secondary | ICD-10-CM

## 2024-06-20 DIAGNOSIS — R159 Full incontinence of feces: Secondary | ICD-10-CM

## 2024-06-20 DIAGNOSIS — I700 Atherosclerosis of aorta: Secondary | ICD-10-CM | POA: Diagnosis not present

## 2024-06-20 DIAGNOSIS — R0781 Pleurodynia: Secondary | ICD-10-CM | POA: Diagnosis not present

## 2024-06-20 DIAGNOSIS — S20212A Contusion of left front wall of thorax, initial encounter: Secondary | ICD-10-CM

## 2024-06-20 DIAGNOSIS — R32 Unspecified urinary incontinence: Secondary | ICD-10-CM | POA: Diagnosis not present

## 2024-06-20 MED ORDER — ALBUTEROL SULFATE HFA 108 (90 BASE) MCG/ACT IN AERS: 2.0000 | INHALATION_SPRAY | Freq: Four times a day (QID) | RESPIRATORY_TRACT | 2 refills | Status: AC | PRN

## 2024-06-20 MED ORDER — MIRABEGRON ER 25 MG PO TB24: 25.0000 mg | ORAL_TABLET | Freq: Every day | ORAL | 1 refills | Status: DC

## 2024-06-20 MED ORDER — BREZTRI AEROSPHERE 160-9-4.8 MCG/ACT IN AERO: 2.0000 | INHALATION_SPRAY | Freq: Two times a day (BID) | RESPIRATORY_TRACT | Status: AC

## 2024-06-20 NOTE — Patient Instructions (Addendum)
 I appreciate the opportunity to provide care to you today!    Follow up:  2 weeks with pcp   Labs: please stop by the lab today to get your blood drawn (CBC, BMP)   Chest Injury Management Plan: -Please stop by [facility] to get a chest X-ray to rule out rib fractures, especially if symptoms are not improving. -Continue with over-the-counter analgesics (e.g., acetaminophen ) as needed for pain relief. -Avoid strenuous activity and heavy lifting for the next 1-2 weeks. -Encourage gentle mobility to help prevent stiffness or other complications.  Monitor for signs of complications such as: -Shortness of breath -Increased chest pain -Signs of internal injury -Please follow up if symptoms worsen or fail to improve.  Urinary Incontinence: -Start Myrbetriq 25 mg orally once daily. -Monitor for improvement in urinary urgency, frequency, and incontinence episodes. -Follow up if symptoms persist or if you experience side effects such as elevated blood pressure, headache, or urinary retention.    Referrals today-  GI for fecal incontinence      Please continue to a heart-healthy diet and increase your physical activities. Try to exercise for at least five days a week.    It was a pleasure to see you and I look forward to continuing to work together on your health and well-being. Please do not hesitate to call the office if you need care or have questions about your care.  In case of emergency, please visit the Emergency Department for urgent care, or contact our clinic at [phone] to schedule an appointment. We're here to help you!   Have a wonderful day and week. With Gratitude, Asaph Serena MSN, FNP-BC

## 2024-06-20 NOTE — Progress Notes (Signed)
 Acute Office Visit  Subjective:    Patient ID: Brett Elliott, male    DOB: 1946-01-05, 78 y.o.   MRN: [id number]  Chief Complaint  Patient presents with   Fall    Feel 2 weeks ago and was stuck in between the tub and bathtub. Bruising to his left ribs.    Urinary Incontinence    Has been losing control of his bowel and bladder     HPI The patient presents today reporting a fall that occurred approximately two weeks ago while attempting to get out of the shower. He landed on his left side. He denies any head injury or loss of consciousness. Since the fall, he has experienced persistent soreness and discomfort on the left side of his ribs, particularly with deep breathing, lying down, sitting, and standing. He denies chest pain and has not experienced increased shortness of breath.  The patient is accompanied by his wife, who reports that he has appeared more depressed over the past week following the recent death of his sister, who was found deceased, fully dressed, in her bed.  Additionally, the patient's wife reports ongoing issues with urinary incontinence. About one month ago, he began experiencing fecal urgency. However, his bowel movements remain regular and are not loose. The patient denies back pain, lower extremity numbness or weakness, or fever.  Past Medical History:  Diagnosis Date   Anxiety    Brain aneurysm    Hypertension     Past Surgical History:  Procedure Laterality Date   BRAIN SURGERY     CATARACT EXTRACTION W/ INTRAOCULAR LENS IMPLANT Bilateral    IR RADIOLOGIST EVAL & MGMT  07/03/2017    No family history on file.  Social History   Socioeconomic History   Marital status: Married    Spouse name: Not on file   Number of children: Not on file   Years of education: Not on file   Highest education level: Not on file  Occupational History   Not on file  Tobacco Use   Smoking status: Every Day    Current packs/day: 1.00    Types: Cigarettes    Smokeless tobacco: Never  Substance and Sexual Activity   Alcohol use: No   Drug use: No   Sexual activity: Not on file  Other Topics Concern   Not on file  Social History Narrative   Not on file   Social Drivers of Health   Financial Resource Strain: Medium Risk (08/05/2021)   Overall Financial Resource Strain (CARDIA)    Difficulty of Paying Living Expenses: Somewhat hard  Food Insecurity: No Food Insecurity (08/05/2021)   Hunger Vital Sign    Worried About Running Out of Food in the Last Year: Never true    Ran Out of Food in the Last Year: Never true  Transportation Needs: No Transportation Needs (08/05/2021)   PRAPARE - Administrator, Civil Service (Medical): No    Lack of Transportation (Non-Medical): No  Physical Activity: Inactive (08/05/2021)   Exercise Vital Sign    Days of Exercise per Week: 0 days    Minutes of Exercise per Session: 0 min  Stress: No Stress Concern Present (08/05/2021)   [employer]ational [employer] - Occupational Stress Questionnaire    Feeling of Stress : Only a little  Social Connections: Moderately Isolated (08/05/2021)   Social Connection and Isolation Panel    Frequency of Communication with Friends and Family: More than three times a week  Frequency of Social Gatherings with Friends and Family: More than three times a week    Attends Religious Services: Never    Database administrator or Organizations: No    Attends Banker Meetings: Never    Marital Status: Married  Catering manager Violence: Not At Risk (08/05/2021)   Humiliation, Afraid, Rape, and Kick questionnaire    Fear of Current or Ex-Partner: No    Emotionally Abused: No    Physically Abused: No    Sexually Abused: No    Outpatient Medications Prior to Visit  Medication Sig Dispense Refill   amLODipine  (NORVASC ) 5 MG tablet Take 1 tablet (5 mg total) by mouth daily. 90 tablet 3   aspirin  EC 325 MG tablet Take 325 mg by mouth daily as needed for  mild pain or moderate pain.     atorvastatin  (LIPITOR) 20 MG tablet Take 1 tablet (20 mg total) by mouth daily. 90 tablet 3   buPROPion  (WELLBUTRIN  XL) 150 MG 24 hr tablet Take 1 tablet (150 mg total) by mouth daily. 90 tablet 1   carvedilol  (COREG ) 6.25 MG tablet Take 1 tablet (6.25 mg total) by mouth 2 (two) times daily with a meal. 180 tablet 3   escitalopram  (LEXAPRO ) 20 MG tablet Take 20 mg by mouth daily.     hydrOXYzine  (VISTARIL ) 25 MG capsule TAKE 1 CAPSULE BY MOUTH EVERY 12 HOURS AS NEEDED FOR ANXIETY 30 capsule 0   Misc. Devices MISC Blood pressure cuff - 1. 1 each 0   telmisartan  (MICARDIS ) 80 MG tablet Take 1 tablet (80 mg total) by mouth daily. 90 tablet 3   traZODone  (DESYREL ) 50 MG tablet Take 1 tablet (50 mg total) by mouth at bedtime as needed for sleep. 30 tablet 3   albuterol  (VENTOLIN  HFA) 108 (90 Base) MCG/ACT inhaler Inhale 2 puffs into the lungs every 6 (six) hours as needed for wheezing or shortness of breath. 18 g 2   budeson-glycopyrrolate-formoterol (BREZTRI  AEROSPHERE) 160-9-4.8 MCG/ACT AERO Inhale 2 puffs into the lungs 2 (two) times daily. 10.7 g 11   No facility-administered medications prior to visit.    Allergies  Allergen Reactions   Cephalexin Hives and Rash    Review of Systems  Constitutional:  Negative for fatigue and fever.  Eyes:  Negative for visual disturbance.  Respiratory:  Negative for chest tightness and shortness of breath.   Cardiovascular:  Negative for chest pain and palpitations.  Skin:  Positive for color change.  Neurological:  Negative for dizziness and headaches.       Objective:    Physical Exam HENT:     Head: Normocephalic.     Right Ear: External ear normal.     Left Ear: External ear normal.     Nose: No congestion or rhinorrhea.     Mouth/Throat:     Mouth: Mucous membranes are moist.  Cardiovascular:     Rate and Rhythm: Regular rhythm.     Heart sounds: No murmur heard. Pulmonary:     Effort: No respiratory  distress.     Breath sounds: Normal breath sounds.  Skin:    Comments: Blue-purple discoloration noted in the lower left abdome  Neurological:     Mental Status: He is alert.     BP 135/73   Pulse 70   Resp 16   Ht 5' 8 (1.727 m)   Wt 220 lb (99.8 kg)   SpO2 (!) 88%   BMI 33.45 kg/m  Wt Readings from  Last 3 Encounters:  06/20/24 220 lb (99.8 kg)  05/14/24 222 lb 6.4 oz (100.9 kg)  02/11/24 224 lb 9.6 oz (101.9 kg)       Assessment & Plan:  Falls, initial encounter Assessment & Plan: Encouraged to stop by [facility] to get a chest X-ray to rule out rib fractures, especially if symptoms are not improving. -Continue with over-the-counter analgesics (e.g., acetaminophen ) as needed for pain relief. -Avoid strenuous activity and heavy lifting for the next 1-2 weeks. -Encourage gentle mobility to help prevent stiffness or other complications.  Encouraged to monitor for signs of complications such as: -Shortness of breath -Increased chest pain -Signs of internal injury -Please follow up if symptoms worsen or fail to improve.  Orders: -     DG Ribs Unilateral W/Chest Left -     CBC with Differential/Platelet -     BMP8+EGFR  Urinary incontinence, unspecified type Assessment & Plan: UA neg for nitrates and leuk Encouraged to start taking Myrbetriq  25 mg orally once daily. Encouraged to monitor for improvement in urinary urgency, frequency, and incontinence episodes. Encouraged to follow up if symptoms persist or if he experience side effects such as elevated blood pressure, headache, or urinary retention.   Orders: -     Mirabegron  ER; Take 1 tablet (25 mg total) by mouth daily.  Dispense: 30 tablet; Refill: 1 -     Urinalysis  Incontinence of feces with fecal urgency Assessment & Plan: Referral placed to GI for collaborative care  Orders: -     Ambulatory referral to Gastroenterology  Chronic obstructive pulmonary disease, unspecified COPD type (HCC) -      Albuterol  Sulfate HFA; Inhale 2 puffs into the lungs every 6 (six) hours as needed for wheezing or shortness of breath.  Dispense: 18 g; Refill: 2 -     Breztri  Aerosphere; Inhale 2 puffs into the lungs 2 (two) times daily.  Note: This chart has been completed using Engineer, civil (consulting) software, and while attempts have been made to ensure accuracy, certain words and phrases may not be transcribed as intended.    Kealan Buchan, FNP

## 2024-06-21 ENCOUNTER — Ambulatory Visit: Payer: Self-pay | Admitting: Family Medicine

## 2024-06-21 DIAGNOSIS — R152 Fecal urgency: Secondary | ICD-10-CM | POA: Insufficient documentation

## 2024-06-21 DIAGNOSIS — R32 Unspecified urinary incontinence: Secondary | ICD-10-CM | POA: Insufficient documentation

## 2024-06-21 DIAGNOSIS — W19XXXA Unspecified fall, initial encounter: Secondary | ICD-10-CM | POA: Insufficient documentation

## 2024-06-21 LAB — CBC WITH DIFFERENTIAL/PLATELET
Basophils Absolute: 0.1 x10E3/uL (ref 0.0–0.2)
Basos: 1 %
EOS (ABSOLUTE): 0.1 x10E3/uL (ref 0.0–0.4)
Eos: 1 %
Hematocrit: 41.3 % (ref 37.5–51.0)
Hemoglobin: 13.7 g/dL (ref 13.0–17.7)
Immature Grans (Abs): 0.1 x10E3/uL (ref 0.0–0.1)
Immature Granulocytes: 1 %
Lymphocytes Absolute: 1.4 x10E3/uL (ref 0.7–3.1)
Lymphs: 13 %
MCH: 30.5 pg (ref 26.6–33.0)
MCHC: 33.2 g/dL (ref 31.5–35.7)
MCV: 92 fL (ref 79–97)
Monocytes Absolute: 0.7 x10E3/uL (ref 0.1–0.9)
Monocytes: 6 %
Neutrophils Absolute: 8.5 x10E3/uL — ABNORMAL HIGH (ref 1.4–7.0)
Neutrophils: 78 %
Platelets: 323 x10E3/uL (ref 150–450)
RBC: 4.49 x10E6/uL (ref 4.14–5.80)
RDW: 14.9 % (ref 11.6–15.4)
WBC: 10.8 x10E3/uL (ref 3.4–10.8)

## 2024-06-21 LAB — BMP8+EGFR
BUN/Creatinine Ratio: 9 — ABNORMAL LOW (ref 10–24)
BUN: 10 mg/dL (ref 8–27)
CO2: 25 mmol/L (ref 20–29)
Calcium: 9.2 mg/dL (ref 8.6–10.2)
Chloride: 92 mmol/L — ABNORMAL LOW (ref 96–106)
Creatinine, Ser: 1.14 mg/dL (ref 0.76–1.27)
Glucose: 104 mg/dL — ABNORMAL HIGH (ref 70–99)
Potassium: 4.5 mmol/L (ref 3.5–5.2)
Sodium: 132 mmol/L — ABNORMAL LOW (ref 134–144)
eGFR: 66 mL/min/1.73 (ref >59)

## 2024-06-21 NOTE — Assessment & Plan Note (Signed)
 Referral placed to GI for collaborative care

## 2024-06-21 NOTE — Progress Notes (Signed)
 Please inform the patient: You likely have a small crack or break (called a minimally displaced fracture) in one of your left-side ribs -- specifically, the 8th rib toward the front. The term "minimally displaced" means the bone is slightly cracked or moved, but the pieces are still mostly in place and lined up well.  The good news is there are no signs of serious complications. There's no pneumothorax, which means your lung has not collapsed, and there are no other lung problems seen on the scan.  I recommend otc Tylenol  as needed for pain relief.Encourage deep breathing exercises several times a day to prevent lung complications such as pneumonia.Apply ice packs to the area to reduce pain and swelling (15-20 minutes at a time, several times a day).  Heat therapy (like a warm compress) can be used to ease muscle tension. Avoid heavy lifting, strenuous activity, or anything that causes pain while healing.  Encourage light walking to help maintain circulation and prevent stiffness.

## 2024-06-21 NOTE — Assessment & Plan Note (Addendum)
 UA neg for nitrates and leuk Encouraged to start taking Myrbetriq  25 mg orally once daily. Encouraged to monitor for improvement in urinary urgency, frequency, and incontinence episodes. Encouraged to follow up if symptoms persist or if he experience side effects such as elevated blood pressure, headache, or urinary retention.

## 2024-06-21 NOTE — Assessment & Plan Note (Signed)
 Encouraged to stop by [facility] to get a chest X-ray to rule out rib fractures, especially if symptoms are not improving. -Continue with over-the-counter analgesics (e.g., acetaminophen ) as needed for pain relief. -Avoid strenuous activity and heavy lifting for the next 1-2 weeks. -Encourage gentle mobility to help prevent stiffness or other complications.  Encouraged to monitor for signs of complications such as: -Shortness of breath -Increased chest pain -Signs of internal injury -Please follow up if symptoms worsen or fail to improve.

## 2024-06-23 ENCOUNTER — Telehealth: Payer: Self-pay

## 2024-06-23 ENCOUNTER — Telehealth: Payer: Self-pay | Admitting: Internal Medicine

## 2024-06-23 ENCOUNTER — Other Ambulatory Visit: Payer: Self-pay | Admitting: Family Medicine

## 2024-06-23 DIAGNOSIS — R32 Unspecified urinary incontinence: Secondary | ICD-10-CM

## 2024-06-23 LAB — URINALYSIS
Bilirubin, UA: NEGATIVE
Glucose, UA: NEGATIVE
Ketones, UA: NEGATIVE
Leukocytes,UA: NEGATIVE
Nitrite, UA: NEGATIVE
Specific Gravity, UA: 1.03 (ref 1.005–1.030)
Urobilinogen, Ur: 0.2 mg/dL (ref 0.2–1.0)
pH, UA: 6 (ref 5.0–7.5)

## 2024-06-23 NOTE — Telephone Encounter (Signed)
 Copied from CRM [id number]. Topic: Clinical - Prescription Issue >> Jun 23, 2024 11:06 AM Brett Elliott wrote: Reason for CRM: pt stated the medication mirabegron  ER (MYRBETRIQ ) 25 MG TB24 tablet [[id number]] was not covered by the insurance and was wondering if he can get prescribe something different

## 2024-06-23 NOTE — Telephone Encounter (Signed)
 Patient's wife is wanting to know if there is an alternative to the medication prescribed at his recent visit states it costs $200 and they cannot pay that. Please advise patient's wife  Thank you

## 2024-06-23 NOTE — Telephone Encounter (Signed)
 Called pt to inform him that per Meade he's likely to have a small crack or break (called a minimally displaced fracture) in one of your left-side ribs -- specifically, the 8th rib toward the front. The term "minimally displaced" means the bone is slightly cracked or moved, but the pieces are still mostly in place and lined up well.  The good news is there are no signs of serious complications. There's no pneumothorax, which means your lung has not collapsed, and there are no other lung problems seen on the scan.   I recommend otc Tylenol  as needed for pain relief.Encourage deep breathing exercises several times a day to prevent lung complications such as pneumonia.Apply ice packs to the area to reduce pain and swelling (15-20 minutes at a time, several times a day).  Heat therapy (like a warm compress) can be used to ease muscle tension. Avoid heavy lifting, strenuous activity, or anything that causes pain while healing.  Encourage light walkin

## 2024-06-23 NOTE — Telephone Encounter (Signed)
 Message sent to [facility] for change

## 2024-06-23 NOTE — Telephone Encounter (Signed)
 Called pt to inform him that his recent lab results show that your BUN/creatinine ratio, sodium, and chloride levels are slightly low. To help improve these levels, I recommend eating a well-balanced diet rich in fruits, vegetables, and whole grains.

## 2024-06-24 ENCOUNTER — Encounter (INDEPENDENT_AMBULATORY_CARE_PROVIDER_SITE_OTHER): Payer: Self-pay | Admitting: *Deleted

## 2024-06-25 NOTE — Telephone Encounter (Signed)
 lmtrc

## 2024-07-03 ENCOUNTER — Ambulatory Visit: Admitting: Internal Medicine

## 2024-07-07 ENCOUNTER — Other Ambulatory Visit: Payer: Self-pay

## 2024-07-07 ENCOUNTER — Encounter (INDEPENDENT_AMBULATORY_CARE_PROVIDER_SITE_OTHER): Payer: Self-pay | Admitting: Gastroenterology

## 2024-07-07 ENCOUNTER — Ambulatory Visit (HOSPITAL_COMMUNITY)
Admission: RE | Admit: 2024-07-07 | Discharge: 2024-07-07 | Disposition: A | Discharge status: Home / Self Care | Source: Ambulatory Visit | Attending: Gastroenterology | Admitting: Gastroenterology

## 2024-07-07 ENCOUNTER — Ambulatory Visit (INDEPENDENT_AMBULATORY_CARE_PROVIDER_SITE_OTHER): Admitting: Gastroenterology

## 2024-07-07 ENCOUNTER — Telehealth: Payer: Self-pay | Admitting: Internal Medicine

## 2024-07-07 VITALS — BP 131/75 | HR 51 | Temp 97.7°F | Ht 68.0 in | Wt 223.7 lb

## 2024-07-07 DIAGNOSIS — R197 Diarrhea, unspecified: Secondary | ICD-10-CM | POA: Insufficient documentation

## 2024-07-07 DIAGNOSIS — F419 Anxiety disorder, unspecified: Secondary | ICD-10-CM

## 2024-07-07 DIAGNOSIS — Z6834 Body mass index (BMI) 34.0-34.9, adult: Secondary | ICD-10-CM | POA: Diagnosis not present

## 2024-07-07 DIAGNOSIS — I10 Essential (primary) hypertension: Secondary | ICD-10-CM

## 2024-07-07 DIAGNOSIS — R159 Full incontinence of feces: Secondary | ICD-10-CM | POA: Diagnosis not present

## 2024-07-07 DIAGNOSIS — R195 Other fecal abnormalities: Secondary | ICD-10-CM | POA: Diagnosis not present

## 2024-07-07 MED ORDER — HYDROXYZINE PAMOATE 25 MG PO CAPS: 25.0000 mg | ORAL_CAPSULE | Freq: Three times a day (TID) | ORAL | 0 refills | Status: DC

## 2024-07-07 MED ORDER — AMLODIPINE BESYLATE 5 MG PO TABS: 5.0000 mg | ORAL_TABLET | Freq: Every day | ORAL | 3 refills | Status: AC

## 2024-07-07 MED ORDER — ESCITALOPRAM OXALATE 20 MG PO TABS
20.0000 mg | ORAL_TABLET | Freq: Every day | ORAL | 3 refills | Status: AC
Start: 2024-07-07 — End: ?

## 2024-07-07 MED ORDER — CARVEDILOL 6.25 MG PO TABS: 6.2500 mg | ORAL_TABLET | Freq: Two times a day (BID) | ORAL | 3 refills | Status: AC

## 2024-07-07 MED ORDER — POLYETHYLENE GLYCOL 3350 17 G PO PACK: 17.0000 g | PACK | Freq: Two times a day (BID) | ORAL | 0 refills | Status: AC

## 2024-07-07 MED ORDER — PSYLLIUM 58.6 % PO PACK: 1.0000 | PACK | Freq: Two times a day (BID) | ORAL | 2 refills | Status: AC

## 2024-07-07 NOTE — Telephone Encounter (Signed)
 Prescription Request  07/07/2024  LOV: 05/14/2024  What is the name of the medication or equipment? carvedilol  (COREG ) 6.25 MG tablet [[id number]]   amLODipine  (NORVASC ) 5 MG tablet [[id number]]   hydrOXYzine  (VISTARIL ) 25 MG capsule [[id number]]   escitalopram  (LEXAPRO ) 20 MG tablet [[id number]]    Have you contacted your pharmacy to request a refill? Yes   Which pharmacy would you like this sent to?  [facility] [address], [place] - [address] [place] [address] Phone: [phone] Fax: [phone]    Patient notified that their request is being sent to the clinical staff for review and that they should receive a response within 2 business days.   Please advise at walked into the office

## 2024-07-07 NOTE — Progress Notes (Signed)
 Brett Elliott , M.D. Gastroenterology & Hepatology [facility] Gastroenterology [address] [place], [place] [address] Primary Care Physician: Tobie Suzzane POUR, MD [address] [place] [address]  Chief Complaint: Fecal incontinence  History of Present Illness: Brett Elliott is a 78 y.o. male with hypertension, cerebral aneurysm s/p endovascular coiling and hyperlipidemia, anxiety, tobacco abuse, osteoarthritis and chronic genital wart who presents for evaluation of fecal incontinence   Patient reports that he will have 2-3 bowel movements daily which are always liquid in consistency.  Has sense of incomplete evacuation.  Most of the time patient has accident with he will defecate before reaching to the bathroom.  Patient diet is low in fiber and fluids. The patient denies having any nausea, vomiting, fever, chills, hematochezia, melena, hematemesis, abdominal distention, abdominal pain, , jaundice, pruritus or weight loss.  Cologuard test NEGATIVE 2023  Last labs from 05/2024 hemoglobin 13.7 platelet 323 Normal liver enzymes hemoglobin A1c 6.0  Last ZHI:wnwz Last Colonoscopy:none  FHx: neg for any gastrointestinal/liver disease, no malignancies Social: smokes about 0.5 pack/day  Surgical: no abdominal surgeries  Past Medical History: Past Medical History:  Diagnosis Date   Anxiety    Brain aneurysm    Hypertension     Past Surgical History: Past Surgical History:  Procedure Laterality Date   BRAIN SURGERY     CATARACT EXTRACTION W/ INTRAOCULAR LENS IMPLANT Bilateral    IR RADIOLOGIST EVAL & MGMT  07/03/2017    Family History:History reviewed. No pertinent family history.  Social History: Social History   Tobacco Use  Smoking Status Every Day   Current packs/day: 1.00   Types: Cigarettes  Smokeless Tobacco Never   Social History   Substance and Sexual Activity  Alcohol Use No   Social History   Substance and Sexual  Activity  Drug Use No    Allergies: Allergies  Allergen Reactions   Cephalexin Hives and Rash    Medications: Current Outpatient Medications  Medication Sig Dispense Refill   amLODipine  (NORVASC ) 5 MG tablet Take 1 tablet (5 mg total) by mouth daily. 90 tablet 3   atorvastatin  (LIPITOR) 20 MG tablet Take 1 tablet (20 mg total) by mouth daily. 90 tablet 3   buPROPion  (WELLBUTRIN  XL) 150 MG 24 hr tablet Take 1 tablet (150 mg total) by mouth daily. 90 tablet 1   carvedilol  (COREG ) 6.25 MG tablet Take 1 tablet (6.25 mg total) by mouth 2 (two) times daily with a meal. 180 tablet 3   escitalopram  (LEXAPRO ) 20 MG tablet Take 20 mg by mouth daily.     hydrOXYzine  (VISTARIL ) 25 MG capsule TAKE 1 CAPSULE BY MOUTH EVERY 12 HOURS AS NEEDED FOR ANXIETY 30 capsule 0   Misc. Devices MISC Blood pressure cuff - 1. 1 each 0   polyethylene glycol (MIRALAX  / GLYCOLAX ) 17 g packet Take 17 g by mouth 2 (two) times daily. 180 packet 0   psyllium (METAMUCIL) 58.6 % packet Take 1 packet by mouth 2 (two) times daily. 60 packet 2   telmisartan  (MICARDIS ) 80 MG tablet Take 1 tablet (80 mg total) by mouth daily. 90 tablet 3   traZODone  (DESYREL ) 50 MG tablet Take 1 tablet (50 mg total) by mouth at bedtime as needed for sleep. 30 tablet 3   albuterol  (VENTOLIN  HFA) 108 (90 Base) MCG/ACT inhaler Inhale 2 puffs into the lungs every 6 (six) hours as needed for wheezing or shortness of breath. (Patient not taking: Reported on 07/07/2024) 18  g 2   aspirin  EC 325 MG tablet Take 325 mg by mouth daily as needed for mild pain or moderate pain. (Patient not taking: Reported on 07/07/2024)     budesonide-glycopyrrolate-formoterol (BREZTRI  AEROSPHERE) 160-9-4.8 MCG/ACT AERO inhaler Inhale 2 puffs into the lungs 2 (two) times daily. (Patient not taking: Reported on 07/07/2024)     mirabegron  ER (MYRBETRIQ ) 25 MG TB24 tablet Take 1 tablet (25 mg total) by mouth daily. (Patient not taking: Reported on 07/07/2024) 30 tablet 1   No  current facility-administered medications for this visit.    Review of Systems: GENERAL: negative for malaise, night sweats HEENT: No changes in hearing or vision, no nose bleeds or other nasal problems. NECK: Negative for lumps, goiter, pain and significant neck swelling RESPIRATORY: Negative for cough, wheezing CARDIOVASCULAR: Negative for chest pain, leg swelling, palpitations, orthopnea GI: SEE HPI MUSCULOSKELETAL: Negative for joint pain or swelling, back pain, and muscle pain. SKIN: Negative for lesions, rash HEMATOLOGY Negative for prolonged bleeding, bruising easily, and swollen nodes. ENDOCRINE: Negative for cold or heat intolerance, polyuria, polydipsia and goiter. NEURO: negative for tremor, gait imbalance, syncope and seizures. The remainder of the review of systems is noncontributory.   Physical Exam: BP 131/75   Pulse (!) 51   Temp 97.7 F (36.5 C)   Ht 5' 8 (1.727 m)   Wt 223 lb 11.2 oz (101.5 kg)   BMI 34.01 kg/m  GENERAL: The patient is AO x3, in no acute distress. HEENT: Head is normocephalic and atraumatic. EOMI are intact. Mouth is well hydrated and without lesions. NECK: Supple. No masses LUNGS: Clear to auscultation. No presence of rhonchi/wheezing/rales. Adequate chest expansion HEART: RRR, normal s1 and s2. ABDOMEN: Soft, nontender, no guarding, no peritoneal signs, and nondistended. BS +. No masses.   Imaging/Labs: as above     Latest Ref Rng & Units 06/20/2024    2:42 PM 08/13/2023    9:29 AM 08/03/2022    4:31 PM  CBC  WBC 3.4 - 10.8 x10E3/uL 10.8  9.6  9.1   Hemoglobin 13.0 - 17.7 g/dL 86.2  85.5  84.0   Hematocrit 37.5 - 51.0 % 41.3  44.5  47.8   Platelets 150 - 450 x10E3/uL 323  286  259    No results found for: IRON, TIBC, FERRITIN  I personally reviewed and interpreted the available labs, imaging and endoscopic files.  Impression and Plan: Brett Elliott is a 78 y.o. male with hypertension, cerebral aneurysm s/p endovascular  coiling and hyperlipidemia, anxiety, tobacco abuse, osteoarthritis and chronic genital wart who presents for evaluation of fecal incontinence   #Fecal incontinence  Patient is low in fluid and fiber.  On exam today as palpable stool.  Likely has overflow diarrhea  Cologuard test Negative 2 years ago  Labs without anemia, no other alarm symptoms such as hematochezia or weight loss  If you start with obtaining abdominal x-ray to evaluate stool burden ; if large amount is found we will give a bowel purge  Ensure adequate fluid intake: Aim for 8 glasses of water daily. Follow a high fiber diet: Include foods such as dates, prunes, pears, and kiwi. Take Miralax  twice a day for the first week, then reduce to once daily thereafter. Use Metamucil twice a day.  If above lifestyle modification does not improve patient condition stable may obtain stool studies and possible colonoscopy  #BMI 34      - walking at a brisk pace/biking at moderate intesity 2.5-5 hours per week     -  use pedometer/step counter to track activity     - goal to lose 5-10% of initial body weight     - avoid suagry drinks and juices, use zero calorie beverages     - increase water intake     - eat a low carb diet with plenty of veggies and fruit     - Get sufficient sleep 7-8 hrs nightly     - maitain active lifestyle     - avoid alcohol     - recommend 2-3 cups Coffee daily     - Counsel on lowering cholesterol by having a diet rich in vegetables,          protein (avoid red meats) and good fats(fish, salmon).  All questions were answered.      Brett Fatica Faizan Berma Harts, MD Gastroenterology and Hepatology [facility] Gastroenterology   This chart has been completed using [facility] Dictation software, and while attempts have been made to ensure accuracy , certain words and phrases may not be transcribed as intended

## 2024-07-07 NOTE — Patient Instructions (Signed)
 It was very nice to meet you today, as dicussed with will plan for the following :  1)Abdominal xray    2)Ensure adequate fluid intake: Aim for 8 glasses of water daily. Follow a high fiber diet: Include foods such as dates, prunes, pears, and kiwi. Take Miralax  twice a day for the first week, then reduce to once daily thereafter. Use Metamucil twice a day.

## 2024-07-07 NOTE — Telephone Encounter (Signed)
 Prescriptions sent

## 2024-07-22 ENCOUNTER — Ambulatory Visit (INDEPENDENT_AMBULATORY_CARE_PROVIDER_SITE_OTHER): Payer: Self-pay | Admitting: Gastroenterology

## 2024-07-22 MED ORDER — PEG 3350-KCL-NA BICARB-NACL 420 G PO SOLR: 4000.0000 mL | Freq: Once | ORAL | 0 refills | Status: DC

## 2024-07-22 NOTE — Progress Notes (Signed)
 Hi [name] ,  Can you please call the patient and tell the patient the xray shows moderate stool burden which can present as diarrhea and fecal incontinence called overflow diarrhea  I recommend taking Golytely  4 liters on day 1 for bowel purge and than metamucil/miralax  once daily everyday after   Thanks,  Andri Prestia Faizan Kasara Schomer, MD Gastroenterology and Hepatology [facility] Gastroenterology  ============================  IMPRESSION: 1. Moderate colonic stool burden, primarily right and transverse colon. No abnormal rectal distention. 2. Nonspecific 5 mm calcification in the right lower abdomen. IMPRESSION: 1. Moderate colonic stool burden, primarily right and transverse colon. No abnormal rectal distention. 2. Nonspecific 5 mm calcification in the right lower abdomen.

## 2024-07-23 ENCOUNTER — Other Ambulatory Visit (INDEPENDENT_AMBULATORY_CARE_PROVIDER_SITE_OTHER): Payer: Self-pay

## 2024-07-23 MED ORDER — PEG 3350-KCL-NA BICARB-NACL 420 G PO SOLR: 4000.0000 mL | Freq: Once | ORAL | 0 refills | Status: AC

## 2024-07-30 ENCOUNTER — Ambulatory Visit: Payer: Self-pay

## 2024-07-30 ENCOUNTER — Telehealth (INDEPENDENT_AMBULATORY_CARE_PROVIDER_SITE_OTHER): Payer: Self-pay | Admitting: Gastroenterology

## 2024-07-30 NOTE — Telephone Encounter (Signed)
 FYI Only or Action Required?: FYI only for provider.  Patient was last seen in primary care on 06/20/2024 by Zarwolo, Gloria, FNP.  Called Nurse Triage reporting Fall, Head Injury, and Leg Injury.  Symptoms began a week ago.  Interventions attempted: OTC medications: BC powder.  Symptoms are: fall with right hip/leg pain and head injury, depression gradually worsening.  Triage Disposition: See Physician Within 24 Hours  Patient/caregiver understands and will follow disposition?: Yes             Copied from CRM #[id number]. Topic: Clinical - Red Word Triage >> Jul 30, 2024  1:57 PM Sophia H wrote: Red Word that prompted transfer to Nurse Triage: Wife Lenward calling on behalf of patient. Had a bad fall last Monday morning, hit his head and he is now having trouble walking, patient has been holding his leg and is complaining of it hurting. NT, wants to schedule an appt Reason for Disposition  [1] MODERATE pain (e.g., interferes with normal activities, limping) AND [2] high-risk adult (e.g., age > 60 years, osteoporosis, chronic steroid use)  Answer Assessment - Initial Assessment Questions Patient's wife, Lenward, on the phone for triage. She states she meant to call in sooner but the patient told her not to. She is concerned about his fall and injuries as well as his depression is worsening. Triage limited as she states the patient just lies in bed and won't talk to her about his symptoms and she is unsure about certain symptoms or severity/timing.   1. MECHANISM: How did the injury happen? (e.g., twisting injury, direct blow)      Patient was walking around in the dark at home getting a glass of milk. Trip and fell over the cat. Patient was able to get up from the fall. Patient wouldn't tell her what parts of his body hit the floor.  2. ONSET: When did the injury happen? (e.g., minutes, hours ago)      07/21/24 at 4:30.  3. LOCATION: Where is the injury located?      Right  leg/hip; head.  4. APPEARANCE of INJURY: What does the injury look like?  (e.g., deformity of leg)     Looks normal, no deformity.  5. SEVERITY: Can you put weight on that leg? Can you walk?      She states he is having a hard time walking on his right leg, she states he is able to bear weight and walk but he is slow and is more like shuffling and holds his right hip.  6. SIZE: For cuts, bruises, or swelling, ask: How large is it? (e.g., inches or centimeters)      No. She states she was able to check his head and no cuts/bruise/swelling and she states he made him take his pants off so she could look at his hip and no cuts/bruises/swelling.  7. PAIN: Is there pain? If Yes, ask: How bad is the pain?   What does it keep you from doing? (Scale 0-10; or none, mild, moderate, severe)     Yes, she states he has been taking BC powders and complaining of right hip pain.  8. TETANUS: For any breaks in the skin, ask: When was your last tetanus booster?     N/A.  9. OTHER SYMPTOMS: Do you have any other symptoms?      She states he also had a fall in the bathroom in June or July. His wife also mentions she is worried about depression. She states this  year his brother in law and sister have both passed away. She states she has not seen any improvement with his depression. She states she has taken his shot gun away from him. She states he has not shown or expressed any thoughts or plans to harm himself. Asking if patient has shown new confusion, changes in speech or vision and she states he did not know family had stopped by yesterday and she can not say if it is from confusion or if he did not see anyone. She also states she is not sure how long that has been going on for. She states he stays in the bed so she is unsure if he has shown any signs of nausea/vomiting/confusion/changes in speech or vision.   10. PREGNANCY: Is there any chance you are pregnant? When was your last menstrual  period?       N/A.  Protocols used: Leg Injury-A-AH

## 2024-07-30 NOTE — Telephone Encounter (Signed)
 Appt made.

## 2024-07-30 NOTE — Telephone Encounter (Signed)
 Pt wife called in and states that they have not received the medication that was sent in. Asked wife was she speaking about the bowel purge. Pt wife states what ever yall wanted him to take. Advised wife that we have sent in Golytely . Pt wife states pharmacy did not have it. Pt wife also stated that she can not remember what else we wanted pt to get. Advised pt is was Metamucil and Miralax . Pt wife wanted to know if I could text that to her. Informed wife that I was not allowed to text. Asked if they had mychart, pt wife stated yes but she doesn't know how to get on my chart. Asked pt wife if she was in a place where she could write down the names of the med, pt wife states she can not read. Told patient I would call the pharmacy to see if they can get the medications ready and I would see if they can put the miralax  and metamucil in bag with Golytely . Contacted [facility] and they stated the Golytely  was on file and that they would get it ready for pt. Asked associate if they could put the fiber and Miralax  in the bag also and associate stated that he could.

## 2024-07-31 ENCOUNTER — Ambulatory Visit: Payer: Self-pay | Admitting: Family Medicine

## 2024-07-31 ENCOUNTER — Encounter: Payer: Self-pay | Admitting: Family Medicine

## 2024-07-31 VITALS — BP 152/70 | HR 54 | Resp 18 | Ht 68.0 in | Wt 223.0 lb

## 2024-07-31 DIAGNOSIS — I10 Essential (primary) hypertension: Secondary | ICD-10-CM | POA: Diagnosis not present

## 2024-07-31 DIAGNOSIS — M25551 Pain in right hip: Secondary | ICD-10-CM | POA: Diagnosis not present

## 2024-07-31 DIAGNOSIS — W19XXXA Unspecified fall, initial encounter: Secondary | ICD-10-CM | POA: Diagnosis not present

## 2024-07-31 DIAGNOSIS — F331 Major depressive disorder, recurrent, moderate: Secondary | ICD-10-CM

## 2024-07-31 DIAGNOSIS — S0990XA Unspecified injury of head, initial encounter: Secondary | ICD-10-CM

## 2024-07-31 MED ORDER — MELOXICAM 7.5 MG PO TABS: 7.5000 mg | ORAL_TABLET | Freq: Every day | ORAL | 0 refills | Status: AC

## 2024-07-31 NOTE — Assessment & Plan Note (Signed)
 Approx 2 year h/o behavioral change, withfdrwan, non communicative, disengaged with family

## 2024-07-31 NOTE — Patient Instructions (Addendum)
 F/U with Dr Tobie  on 09/18 as scheduled  Head cT scan ordered ,hopefully can be  done this week  Labs today, CBc, cmp and EGFr, TSH,   Meloxicam  is prescribed for hip pain, pls stop BC and you are referred to Dr Margrette  Be careful not to fall again  Thanks for choosing [facility], we consider it a privelige to serve you.

## 2024-08-01 ENCOUNTER — Ambulatory Visit: Payer: Self-pay | Admitting: Family Medicine

## 2024-08-01 LAB — CBC WITH DIFFERENTIAL/PLATELET
Basophils Absolute: 0.1 x10E3/uL (ref 0.0–0.2)
Basos: 1 %
EOS (ABSOLUTE): 0.1 x10E3/uL (ref 0.0–0.4)
Eos: 1 %
Hematocrit: 43 % (ref 37.5–51.0)
Hemoglobin: 14 g/dL (ref 13.0–17.7)
Immature Grans (Abs): 0.1 x10E3/uL (ref 0.0–0.1)
Immature Granulocytes: 1 %
Lymphocytes Absolute: 2.4 x10E3/uL (ref 0.7–3.1)
Lymphs: 21 %
MCH: 30.6 pg (ref 26.6–33.0)
MCHC: 32.6 g/dL (ref 31.5–35.7)
MCV: 94 fL (ref 79–97)
Monocytes Absolute: 1.1 x10E3/uL — ABNORMAL HIGH (ref 0.1–0.9)
Monocytes: 10 %
Neutrophils Absolute: 7.7 x10E3/uL — ABNORMAL HIGH (ref 1.4–7.0)
Neutrophils: 66 %
Platelets: 327 x10E3/uL (ref 150–450)
RBC: 4.57 x10E6/uL (ref 4.14–5.80)
RDW: 15.4 % (ref 11.6–15.4)
WBC: 11.4 x10E3/uL — ABNORMAL HIGH (ref 3.4–10.8)

## 2024-08-01 LAB — CMP14+EGFR
ALT: 10 IU/L (ref 0–44)
AST: 19 IU/L (ref 0–40)
Albumin: 4.3 g/dL (ref 3.8–4.8)
Alkaline Phosphatase: 96 IU/L (ref 44–121)
BUN/Creatinine Ratio: 8 — ABNORMAL LOW (ref 10–24)
BUN: 7 mg/dL — ABNORMAL LOW (ref 8–27)
Bilirubin Total: 0.2 mg/dL (ref 0.0–1.2)
CO2: 26 mmol/L (ref 20–29)
Calcium: 9.1 mg/dL (ref 8.6–10.2)
Chloride: 93 mmol/L — ABNORMAL LOW (ref 96–106)
Creatinine, Ser: 0.85 mg/dL (ref 0.76–1.27)
Globulin, Total: 2.2 g/dL (ref 1.5–4.5)
Glucose: 84 mg/dL (ref 70–99)
Potassium: 4.6 mmol/L (ref 3.5–5.2)
Sodium: 134 mmol/L (ref 134–144)
Total Protein: 6.5 g/dL (ref 6.0–8.5)
eGFR: 89 mL/min/1.73 (ref >59)

## 2024-08-01 LAB — TSH: TSH: 1.59 u[IU]/mL (ref 0.450–4.500)

## 2024-08-05 ENCOUNTER — Encounter: Payer: Self-pay | Admitting: Family Medicine

## 2024-08-05 DIAGNOSIS — W19XXXA Unspecified fall, initial encounter: Secondary | ICD-10-CM | POA: Insufficient documentation

## 2024-08-05 DIAGNOSIS — M25551 Pain in right hip: Secondary | ICD-10-CM | POA: Insufficient documentation

## 2024-08-05 NOTE — Assessment & Plan Note (Signed)
 Reports direct head trauma with behavioral change, increased social withdrawal head CT scan

## 2024-08-05 NOTE — Progress Notes (Signed)
   Brett Elliott     MRN: [id number]      DOB: 22-Mar-1946  Chief Complaint  Patient presents with   Hip Pain    Pt states he fell last Monday, hit his head but no apparent injury. Pt states he is having right hip pain since then     HPI Brett Elliott is here with above concern  Reports hitting his head and being unable to walk as he did with increased right hip pain C/o excessive fatigue, spouse is in the room and is the main historian reports change in behavior over the year ,  more withdrawn, less interactive, not communicating  ROS Denies recent fever or chills. Denies sinus pressure, nasal congestion, ear pain or sore throat. Denies chest congestion, productive cough or wheezing. Denies chest pains, palpitations and leg swelling . Denies skin break down or rash.   PE  BP (!) 152/70   Pulse (!) 54   Resp 18   Ht 5' 8 (1.727 m)   Wt 223 lb 0.6 oz (101.2 kg)   SpO2 93%   BMI 33.91 kg/m   Patient alert  and in no cardiopulmonary distress.  HEENT: No facial asymmetry, EOMI,     Neck supple .  Chest: Clear to auscultation bilaterally.  CVS: S1, S2 no murmurs, no S3.Regular rate.    Ext: No edema  MS: decreased ROM lumbar spine and hip Skin: Intact, Psych: Good eye contact, normal affect. Memory intact not anxious or depressed appearing.  CNS: CN 2-12 intact, power,  normal throughout.no focal deficits noted.   Assessment & Plan  Head trauma Approx 2 year h/o behavioral change, withfdrwan, non communicative, disengaged with family  Acute hip pain, right Increased pain with limitation in mobility following fall Orthopedic assessment and management asap  Falls, initial encounter Reports direct head trauma with behavioral change, increased social withdrawal head CT scan  Hypertension Not at goal, lifestyle change encouraged , no med change will f/u with PCP  MDD (major depressive disorder), recurrent episode (HCC) Not  responding to current treatment if he  is complying, PCP follow up and management

## 2024-08-05 NOTE — Assessment & Plan Note (Signed)
 Not  responding to current treatment if he is complying, PCP follow up and management

## 2024-08-05 NOTE — Assessment & Plan Note (Addendum)
 Increased pain with limitation in mobility following fall Orthopedic assessment and management asap

## 2024-08-05 NOTE — Assessment & Plan Note (Signed)
 Not at goal, lifestyle change encouraged , no med change will f/u with PCP

## 2024-08-14 ENCOUNTER — Ambulatory Visit (INDEPENDENT_AMBULATORY_CARE_PROVIDER_SITE_OTHER): Admitting: Internal Medicine

## 2024-08-14 ENCOUNTER — Encounter: Payer: Self-pay | Admitting: Internal Medicine

## 2024-08-14 VITALS — BP 111/64 | HR 63 | Ht 68.0 in | Wt 229.2 lb

## 2024-08-14 DIAGNOSIS — M51362 Other intervertebral disc degeneration, lumbar region with discogenic back pain and lower extremity pain: Secondary | ICD-10-CM | POA: Insufficient documentation

## 2024-08-14 DIAGNOSIS — Z0001 Encounter for general adult medical examination with abnormal findings: Secondary | ICD-10-CM

## 2024-08-14 DIAGNOSIS — Z23 Encounter for immunization: Secondary | ICD-10-CM

## 2024-08-14 DIAGNOSIS — I10 Essential (primary) hypertension: Secondary | ICD-10-CM | POA: Diagnosis not present

## 2024-08-14 DIAGNOSIS — J449 Chronic obstructive pulmonary disease, unspecified: Secondary | ICD-10-CM

## 2024-08-14 DIAGNOSIS — F331 Major depressive disorder, recurrent, moderate: Secondary | ICD-10-CM

## 2024-08-14 MED ORDER — QUETIAPINE FUMARATE 25 MG PO TABS: 25.0000 mg | ORAL_TABLET | Freq: Two times a day (BID) | ORAL | 3 refills | Status: AC

## 2024-08-14 MED ORDER — PREDNISONE 20 MG PO TABS: ORAL_TABLET | ORAL | 0 refills | Status: AC

## 2024-08-14 MED ORDER — BREZTRI AEROSPHERE 160-9-4.8 MCG/ACT IN AERO: 2.0000 | INHALATION_SPRAY | Freq: Two times a day (BID) | RESPIRATORY_TRACT | Status: AC

## 2024-08-14 NOTE — Assessment & Plan Note (Signed)
 Physical exam as documented. Counseling done  re healthy lifestyle involving commitment to 150 minutes exercise per week, heart healthy diet, and attaining healthy weight.The importance of adequate sleep also discussed. Immunization and cancer screening needs are specifically addressed at this visit.

## 2024-08-14 NOTE — Assessment & Plan Note (Signed)
 BP Readings from Last 1 Encounters:  08/14/24 111/64   Well-controlled with Telmisartan  80 mg QD, amlodipine  5 mg QD and Coreg  6.25 mg BID now Had added Coreg  considering his risk for CAD Counseled for compliance with the medications Advised DASH diet and moderate exercise/walking, at least 150 mins/wee Needs to quit smoking and be compliant to medications

## 2024-08-14 NOTE — Assessment & Plan Note (Signed)
 Chronic low back pain and hip pain likely due to DDD of lumbar spine Likely also has OA of hip Check x-ray of lumbar spine and pelvis Oral prednisone  taper prescribed Avoid heavy lifting and frequent bending Back brace and/or heating pad as needed Awaiting orthopedic surgery evaluation

## 2024-08-14 NOTE — Assessment & Plan Note (Signed)
 Uncontrolled, due to noncompliance versus domestic stressors On Lexapro  20 mg QD currently, DC Lexapro  Start Wellbutrin  150 mg QD, can also help with tobacco cessation Continue hydroxyzine  for anxiety and insomnia DC Trazodone , started Seroquel  25 mg BID

## 2024-08-14 NOTE — Patient Instructions (Signed)
 Please start taking Prednisone  for hip pain.  Please stop taking Trazodone . Please start taking Seroquel  25 mg twice daily.  Please continue to take medications as prescribed.  Please continue to follow low salt diet and perform moderate exercise/walking as tolerated.

## 2024-08-14 NOTE — Progress Notes (Signed)
 Established Patient Office Visit  Subjective:  Patient ID: Brett Elliott, male    DOB: [birth year]  Age: 78 y.o. MRN: [id number]  CC:  Chief Complaint  Patient presents with  . Hypertension    3 month f/u   . Medical Management of Chronic Issues    3 month f/u     HPI Brett Elliott is a 78 y.o. male with past medical history of hypertension, cerebral aneurysm s/p endovascular coiling and hyperlipidemia, anxiety, tobacco abuse, osteoarthritis and chronic genital wart who presents for f/u of his chronic medical conditions.  HTN: BP is wnl now. Takes medications regularly. Patient denies headache, dizziness, chest pain or palpitations.  He has chronic leg swelling, usually gets better with leg elevation.  Denies orthopnea or PND.  His wife reports that his anxiety is still uncontrolled with Lexapro  now.  He is having anxiety spells especially in the evenings and has insomnia as well. He had felt better with Hydroxyzine  initially. He was placed on trazodone  for insomnia after discussing with [facility] therapist, but has not had much improvement.  His wife reports that he sleeps throughout the day, does not get out of his bedroom and does not like to do his daily activities as well.  Denies any SI or HI.  He does not have severe anger spells now.  He smokes about 2 packs/day now.  He is wheezing today.  He reports coughing spells with clear sputum at times. He has used Breztri  sample, but could not afford it from the pharmacy. Denies any hemoptysis.  He had a fall at home on 07/28/24, leading to head injury.  He was supposed to get CT head, but did not get it scheduled.  He currently denies headache, dizziness or any recent change in vision.  His wife does not report mental status changes currently.  He still complains of low back pain and right hip pain, which is chronic, but worse for the last 2 weeks.  He was referred to orthopedic surgeon recently, but has appointment after 2 weeks.  His pain  is constant, dull, radiating to right buttock area, worse with standing and bending, and better with rest.  He was given meloxicam , but did not get much improvement with it.   Past Medical History:  Diagnosis Date  . Anxiety   . Brain aneurysm   . Hypertension     Past Surgical History:  Procedure Laterality Date  . BRAIN SURGERY    . CATARACT EXTRACTION W/ INTRAOCULAR LENS IMPLANT Bilateral   . IR RADIOLOGIST EVAL & MGMT  07/03/2017    History reviewed. No pertinent family history.  Social History   Socioeconomic History  . Marital status: Married    Spouse name: Not on file  . Number of children: Not on file  . Years of education: Not on file  . Highest education level: Not on file  Occupational History  . Not on file  Tobacco Use  . Smoking status: Every Day    Current packs/day: 1.00    Types: Cigarettes  . Smokeless tobacco: Never  Substance and Sexual Activity  . Alcohol use: No  . Drug use: No  . Sexual activity: Not on file  Other Topics Concern  . Not on file  Social History Narrative  . Not on file   Social Drivers of Health   Financial Resource Strain: Medium Risk (08/05/2021)   Overall Financial Resource Strain (CARDIA)   . Difficulty of Paying Living Expenses: Somewhat hard  Food Insecurity: No Food Insecurity (08/05/2021)   Hunger Vital Sign   . Worried About Programme researcher, broadcasting/film/video in the Last Year: Never true   . Ran Out of Food in the Last Year: Never true  Transportation Needs: No Transportation Needs (08/05/2021)   PRAPARE - Transportation   . Lack of Transportation (Medical): No   . Lack of Transportation (Non-Medical): No  Physical Activity: Inactive (08/05/2021)   Exercise Vital Sign   . Days of Exercise per Week: 0 days   . Minutes of Exercise per Session: 0 min  Stress: No Stress Concern Present (08/05/2021)   [employer] of Occupational Health - Occupational Stress Questionnaire   . Feeling of Stress : Only a little  Social Connections:  Moderately Isolated (08/05/2021)   Social Connection and Isolation Panel   . Frequency of Communication with Friends and Family: More than three times a week   . Frequency of Social Gatherings with Friends and Family: More than three times a week   . Attends Religious Services: Never   . Active Member of Clubs or Organizations: No   . Attends Banker Meetings: Never   . Marital Status: Married  Catering manager Violence: Not At Risk (08/05/2021)   Humiliation, Afraid, Rape, and Kick questionnaire   . Fear of Current or Ex-Partner: No   . Emotionally Abused: No   . Physically Abused: No   . Sexually Abused: No    Outpatient Medications Prior to Visit  Medication Sig Dispense Refill  . albuterol  (VENTOLIN  HFA) 108 (90 Base) MCG/ACT inhaler Inhale 2 puffs into the lungs every 6 (six) hours as needed for wheezing or shortness of breath. 18 g 2  . amLODipine  (NORVASC ) 5 MG tablet Take 1 tablet (5 mg total) by mouth daily. 90 tablet 3  . aspirin  EC 325 MG tablet Take 325 mg by mouth daily as needed for mild pain or moderate pain.    . atorvastatin  (LIPITOR) 20 MG tablet Take 1 tablet (20 mg total) by mouth daily. 90 tablet 3  . budesonide-glycopyrrolate-formoterol (BREZTRI  AEROSPHERE) 160-9-4.8 MCG/ACT AERO inhaler Inhale 2 puffs into the lungs 2 (two) times daily.    . buPROPion  (WELLBUTRIN  XL) 150 MG 24 hr tablet Take 1 tablet (150 mg total) by mouth daily. 90 tablet 1  . carvedilol  (COREG ) 6.25 MG tablet Take 1 tablet (6.25 mg total) by mouth 2 (two) times daily with a meal. 180 tablet 3  . escitalopram  (LEXAPRO ) 20 MG tablet Take 1 tablet (20 mg total) by mouth daily. 90 tablet 3  . meloxicam  (MOBIC ) 7.5 MG tablet Take 1 tablet (7.5 mg total) by mouth daily. 10 tablet 0  . mirabegron  ER (MYRBETRIQ ) 25 MG TB24 tablet Take 1 tablet (25 mg total) by mouth daily. 30 tablet 1  . Misc. Devices MISC Blood pressure cuff - 1. 1 each 0  . polyethylene glycol (MIRALAX  / GLYCOLAX ) 17 g packet  Take 17 g by mouth 2 (two) times daily. 180 packet 0  . psyllium (METAMUCIL) 58.6 % packet Take 1 packet by mouth 2 (two) times daily. 60 packet 2  . telmisartan  (MICARDIS ) 80 MG tablet Take 1 tablet (80 mg total) by mouth daily. 90 tablet 3  . traZODone  (DESYREL ) 50 MG tablet Take 1 tablet (50 mg total) by mouth at bedtime as needed for sleep. 30 tablet 3   No facility-administered medications prior to visit.    Allergies  Allergen Reactions  . Cephalexin Hives and Rash  ROS Review of Systems  Constitutional:  Negative for chills and fever.  HENT:  Negative for congestion and sore throat.   Eyes:  Negative for pain and discharge.  Respiratory:  Positive for cough and shortness of breath (intermittent).   Cardiovascular:  Positive for leg swelling. Negative for chest pain and palpitations.  Gastrointestinal:  Negative for diarrhea, nausea and vomiting.  Endocrine: Negative for polydipsia and polyuria.  Genitourinary:  Negative for dysuria and hematuria.  Musculoskeletal:  Positive for arthralgias (Right hip) and back pain. Negative for neck pain and neck stiffness.  Skin:  Negative for rash.  Neurological:  Negative for dizziness, weakness and numbness.  Psychiatric/Behavioral:  Positive for dysphoric mood and sleep disturbance. Negative for agitation and behavioral problems. The patient is nervous/anxious.       Objective:    Physical Exam Vitals reviewed.  Constitutional:      General: He is not in acute distress.    Appearance: He is not diaphoretic.  HENT:     Head: Normocephalic and atraumatic.     Nose: Nose normal.     Mouth/Throat:     Mouth: Mucous membranes are moist.  Eyes:     General: No scleral icterus.    Extraocular Movements: Extraocular movements intact.  Cardiovascular:     Rate and Rhythm: Normal rate and regular rhythm.     Heart sounds: Normal heart sounds. No murmur heard. Pulmonary:     Breath sounds: Wheezing (Mild, diffuse bilaterally)  present. No rales.  Abdominal:     Palpations: Abdomen is soft.     Tenderness: There is no abdominal tenderness.  Musculoskeletal:     Cervical back: Neck supple. No tenderness.     Lumbar back: Tenderness present. Decreased range of motion. Positive right straight leg raise test.     Right hip: Tenderness present.     Left hip: No tenderness.     Right lower leg: Edema (1+) present.     Left lower leg: Edema (1+) present.  Skin:    General: Skin is warm.     Findings: No rash.  Neurological:     General: No focal deficit present.     Mental Status: He is alert and oriented to person, place, and time.     Cranial Nerves: No cranial nerve deficit.     Sensory: No sensory deficit.     Motor: No weakness.  Psychiatric:        Mood and Affect: Mood is anxious.        Behavior: Behavior is cooperative.     BP 111/64   Pulse 63   Ht 5' 8 (1.727 m)   Wt 229 lb 3.2 oz (104 kg)   SpO2 93%   BMI 34.85 kg/m  Wt Readings from Last 3 Encounters:  08/14/24 229 lb 3.2 oz (104 kg)  07/31/24 223 lb 0.6 oz (101.2 kg)  07/07/24 223 lb 11.2 oz (101.5 kg)    Lab Results  Component Value Date   TSH 1.590 07/31/2024   Lab Results  Component Value Date   WBC 11.4 (H) 07/31/2024   HGB 14.0 07/31/2024   HCT 43.0 07/31/2024   MCV 94 07/31/2024   PLT 327 07/31/2024   Lab Results  Component Value Date   NA 134 07/31/2024   K 4.6 07/31/2024   CO2 26 07/31/2024   GLUCOSE 84 07/31/2024   BUN 7 (L) 07/31/2024   CREATININE 0.85 07/31/2024   BILITOT 0.2 07/31/2024   ALKPHOS 96  07/31/2024   AST 19 07/31/2024   ALT 10 07/31/2024   PROT 6.5 07/31/2024   ALBUMIN 4.3 07/31/2024   CALCIUM  9.1 07/31/2024   ANIONGAP 10 05/22/2017   EGFR 89 07/31/2024   Lab Results  Component Value Date   CHOL 139 02/11/2024   Lab Results  Component Value Date   HDL 33 (L) 02/11/2024   Lab Results  Component Value Date   LDLCALC 70 02/11/2024   Lab Results  Component Value Date   TRIG 217 (H)  02/11/2024   Lab Results  Component Value Date   CHOLHDL 4.2 02/11/2024   Lab Results  Component Value Date   HGBA1C 6.0 (H) 02/11/2024      Assessment & Plan:   Problem List Items Addressed This Visit       Cardiovascular and Mediastinum   Hypertension - Primary   BP Readings from Last 1 Encounters:  08/14/24 111/64   Well-controlled with Telmisartan  80 mg QD, amlodipine  5 mg QD and Coreg  6.25 mg BID now Had added Coreg  considering his risk for CAD Counseled for compliance with the medications Advised DASH diet and moderate exercise/walking, at least 150 mins/wee Needs to quit smoking and be compliant to medications        Respiratory   Chronic obstructive pulmonary disease (HCC)   Has wheezing and coughing spells Needs to quit smoking Had added Breztri  as maintenance treatment - needs to start using it regularly, sample provided again, patient assistance form provided Continue Albuterol  inhaler PRN for now      Relevant Medications   predniSONE  (DELTASONE ) 20 MG tablet   budesonide-glycopyrrolate-formoterol (BREZTRI  AEROSPHERE) 160-9-4.8 MCG/ACT AERO inhaler     Musculoskeletal and Integument   Degeneration of intervertebral disc of lumbar region with discogenic back pain and lower extremity pain   Chronic low back pain and hip pain likely due to DDD of lumbar spine Likely also has OA of hip Check x-ray of lumbar spine and pelvis Oral prednisone  taper prescribed Avoid heavy lifting and frequent bending Back brace and/or heating pad as needed Awaiting orthopedic surgery evaluation      Relevant Medications   predniSONE  (DELTASONE ) 20 MG tablet   Other Relevant Orders   DG Lumbar Spine Complete   DG Pelvis 1-2 Views     Other   MDD (major depressive disorder), recurrent episode (HCC)   Uncontrolled, due to noncompliance versus domestic stressors On Lexapro  20 mg QD currently, DC Lexapro  Start Wellbutrin  150 mg QD, can also help with tobacco  cessation Continue hydroxyzine  for anxiety and insomnia DC Trazodone , started Seroquel  25 mg BID      Relevant Medications   QUEtiapine  (SEROQUEL ) 25 MG tablet   Encounter for general adult medical examination with abnormal findings   Physical exam as documented. Counseling done  re healthy lifestyle involving commitment to 150 minutes exercise per week, heart healthy diet, and attaining healthy weight.The importance of adequate sleep also discussed. Immunization and cancer screening needs are specifically addressed at this visit.      Other Visit Diagnoses       Encounter for immunization       Relevant Orders   Flu vaccine HIGH DOSE PF(Fluzone Trivalent) (Completed)         Meds ordered this encounter  Medications  . predniSONE  (DELTASONE ) 20 MG tablet    Sig: Take 2 tablets (40 mg total) by mouth daily with breakfast for 5 days, THEN 1 tablet (20 mg total) daily with breakfast for 5 days,  THEN 0.5 tablets (10 mg total) daily with breakfast for 4 days.    Dispense:  17 tablet    Refill:  0  . QUEtiapine  (SEROQUEL ) 25 MG tablet    Sig: Take 1 tablet (25 mg total) by mouth 2 (two) times daily.    Dispense:  60 tablet    Refill:  3  . budesonide-glycopyrrolate-formoterol (BREZTRI  AEROSPHERE) 160-9-4.8 MCG/ACT AERO inhaler    Sig: Inhale 2 puffs into the lungs 2 (two) times daily.    Lot Number?:   [id number]    Expiration Date?:   10/26/2026    Quantity:   1    Follow-up: Return in about 4 months (around 12/14/2024) for HTN and GAD.    Suzzane MARLA Blanch, MD

## 2024-08-14 NOTE — Assessment & Plan Note (Addendum)
 Has wheezing and coughing spells Needs to quit smoking Had added Breztri  as maintenance treatment - needs to start using it regularly, sample provided again, patient assistance form provided Continue Albuterol  inhaler PRN for now

## 2024-08-25 ENCOUNTER — Ambulatory Visit: Admitting: Orthopedic Surgery

## 2024-08-26 ENCOUNTER — Telehealth: Payer: Self-pay | Admitting: Internal Medicine

## 2024-08-26 ENCOUNTER — Ambulatory Visit (HOSPITAL_COMMUNITY)
Admission: RE | Admit: 2024-08-26 | Discharge: 2024-08-26 | Disposition: A | Discharge status: Home / Self Care | Source: Ambulatory Visit | Attending: Internal Medicine | Admitting: Internal Medicine

## 2024-08-26 ENCOUNTER — Telehealth: Payer: Self-pay

## 2024-08-26 DIAGNOSIS — M51362 Other intervertebral disc degeneration, lumbar region with discogenic back pain and lower extremity pain: Secondary | ICD-10-CM | POA: Diagnosis not present

## 2024-08-26 DIAGNOSIS — M545 Low back pain, unspecified: Secondary | ICD-10-CM | POA: Diagnosis not present

## 2024-08-26 DIAGNOSIS — M533 Sacrococcygeal disorders, not elsewhere classified: Secondary | ICD-10-CM | POA: Diagnosis not present

## 2024-08-26 NOTE — Telephone Encounter (Signed)
 Per Dr. Tobie pt can take 1 tablet a day.

## 2024-08-26 NOTE — Telephone Encounter (Signed)
 AZ&E application medicines  Copied Noted Sleeved Placed original provider box Copy placed front desk folder  Call patient when ready.

## 2024-08-27 ENCOUNTER — Ambulatory Visit: Admitting: Orthopedic Surgery

## 2024-09-01 ENCOUNTER — Ambulatory Visit: Payer: Self-pay | Admitting: Internal Medicine

## 2024-09-04 ENCOUNTER — Ambulatory Visit (HOSPITAL_COMMUNITY)

## 2024-09-08 NOTE — Progress Notes (Signed)
 No chief complaint on file.   HPI:    PMH: Past Medical History:  Diagnosis Date   Anxiety    Brain aneurysm    Hypertension     Surgical History: Past Surgical History:  Procedure Laterality Date   BRAIN SURGERY     CATARACT EXTRACTION W/ INTRAOCULAR LENS IMPLANT Bilateral    IR RADIOLOGIST EVAL & MGMT  07/03/2017    Home Medications:  Allergies as of 09/09/2024       Reactions   Cephalexin Hives, Rash        Medication List        Accurate as of September 08, 2024  9:01 AM. If you have any questions, ask your nurse or doctor.          albuterol  108 (90 Base) MCG/ACT inhaler Commonly known as: VENTOLIN  HFA Inhale 2 puffs into the lungs every 6 (six) hours as needed for wheezing or shortness of breath.   amLODipine  5 MG tablet Commonly known as: NORVASC  Take 1 tablet (5 mg total) by mouth daily.   aspirin  EC 325 MG tablet Take 325 mg by mouth daily as needed for mild pain or moderate pain.   atorvastatin  20 MG tablet Commonly known as: LIPITOR Take 1 tablet (20 mg total) by mouth daily.   Breztri  Aerosphere 160-9-4.8 MCG/ACT Aero inhaler Generic drug: budesonide-glycopyrrolate-formoterol Inhale 2 puffs into the lungs 2 (two) times daily.   Breztri  Aerosphere 160-9-4.8 MCG/ACT Aero inhaler Generic drug: budesonide-glycopyrrolate-formoterol Inhale 2 puffs into the lungs 2 (two) times daily.   buPROPion  150 MG 24 hr tablet Commonly known as: Wellbutrin  XL Take 1 tablet (150 mg total) by mouth daily.   carvedilol  6.25 MG tablet Commonly known as: COREG  Take 1 tablet (6.25 mg total) by mouth 2 (two) times daily with a meal.   escitalopram  20 MG tablet Commonly known as: LEXAPRO  Take 1 tablet (20 mg total) by mouth daily.   meloxicam  7.5 MG tablet Commonly known as: MOBIC  Take 1 tablet (7.5 mg total) by mouth daily.   mirabegron  ER 25 MG Tb24 tablet Commonly known as: Myrbetriq  Take 1 tablet (25 mg total) by mouth daily.   Misc.  Devices Misc Blood pressure cuff - 1.   polyethylene glycol 17 g packet Commonly known as: MIRALAX  / GLYCOLAX  Take 17 g by mouth 2 (two) times daily.   psyllium 58.6 % packet Commonly known as: METAMUCIL Take 1 packet by mouth 2 (two) times daily.   QUEtiapine  25 MG tablet Commonly known as: SEROquel  Take 1 tablet (25 mg total) by mouth 2 (two) times daily.   telmisartan  80 MG tablet Commonly known as: MICARDIS  Take 1 tablet (80 mg total) by mouth daily.        Allergies:  Allergies  Allergen Reactions   Cephalexin Hives and Rash    Family History: No family history on file.  Social History:  reports that he has been smoking cigarettes. He has never used smokeless tobacco. He reports that he does not drink alcohol and does not use drugs.  ROS: All other review of systems were reviewed and are negative except what is noted above in HPI  Physical Exam: There were no vitals taken for this visit.  Constitutional:  Alert and oriented, No acute distress. HEENT: [place] AT, moist mucus membranes.  Trachea midline, no masses. Cardiovascular: No clubbing, cyanosis, or edema. Respiratory: Normal respiratory effort, no increased work of breathing. GI: No inguinal hernias GU: Normal phallus. No masses/lesions on  penis, testis, scrotum. Prostate ***g smooth no nodules no induration.  Lymph: No cervical or inguinal lymphadenopathy. Skin: No rashes, bruises or suspicious lesions. Neurologic: Grossly intact, no focal deficits, moving all 4 extremities. Psychiatric: Normal mood and affect.  Laboratory Data: Lab Results  Component Value Date   WBC 11.4 (H) 07/31/2024   HGB 14.0 07/31/2024   HCT 43.0 07/31/2024   MCV 94 07/31/2024   PLT 327 07/31/2024    Lab Results  Component Value Date   CREATININE 0.85 07/31/2024    No results found for: PSA  No results found for: TESTOSTERONE  Lab Results  Component Value Date   HGBA1C 6.0 (H) 02/11/2024     Urinalysis   Pertinent Imaging: ***  Assessment:    Plan:    There are no diagnoses linked to this encounter.  No follow-ups on file.  Garnette CHRISTELLA Shack, MD  [facility] [place]

## 2024-09-09 ENCOUNTER — Ambulatory Visit: Admitting: Urology

## 2024-09-09 ENCOUNTER — Encounter: Payer: Self-pay | Admitting: Urology

## 2024-09-09 VITALS — BP 173/75 | HR 56

## 2024-09-09 DIAGNOSIS — F17210 Nicotine dependence, cigarettes, uncomplicated: Secondary | ICD-10-CM

## 2024-09-09 DIAGNOSIS — R32 Unspecified urinary incontinence: Secondary | ICD-10-CM

## 2024-09-09 DIAGNOSIS — R3129 Other microscopic hematuria: Secondary | ICD-10-CM

## 2024-09-09 DIAGNOSIS — N3281 Overactive bladder: Secondary | ICD-10-CM

## 2024-09-09 DIAGNOSIS — R3915 Urgency of urination: Secondary | ICD-10-CM

## 2024-09-09 DIAGNOSIS — R350 Frequency of micturition: Secondary | ICD-10-CM | POA: Diagnosis not present

## 2024-09-09 LAB — BLADDER SCAN AMB NON-IMAGING: Scan Result: 31

## 2024-09-09 MED ORDER — SOLIFENACIN SUCCINATE 10 MG PO TABS: 10.0000 mg | ORAL_TABLET | Freq: Every day | ORAL | 11 refills | Status: AC

## 2024-09-09 NOTE — Progress Notes (Signed)
   Patient can void prior to the bladder scan. Bladder scan result: 31  Performed By: [facility] LPN

## 2024-09-10 LAB — MICROSCOPIC EXAMINATION: Bacteria, UA: NONE SEEN

## 2024-09-10 LAB — URINALYSIS, ROUTINE W REFLEX MICROSCOPIC
Bilirubin, UA: NEGATIVE
Glucose, UA: NEGATIVE
Ketones, UA: NEGATIVE
Leukocytes,UA: NEGATIVE
Nitrite, UA: NEGATIVE
Specific Gravity, UA: 1.025 (ref 1.005–1.030)
Urobilinogen, Ur: 1 mg/dL (ref 0.2–1.0)
pH, UA: 6 (ref 5.0–7.5)

## 2024-09-11 ENCOUNTER — Other Ambulatory Visit: Payer: Self-pay | Admitting: Internal Medicine

## 2024-09-11 DIAGNOSIS — F419 Anxiety disorder, unspecified: Secondary | ICD-10-CM

## 2024-09-15 ENCOUNTER — Other Ambulatory Visit (HOSPITAL_COMMUNITY): Payer: Self-pay

## 2024-09-15 ENCOUNTER — Telehealth: Payer: Self-pay | Admitting: Pharmacy Technician

## 2024-09-15 NOTE — Telephone Encounter (Signed)
 Pharmacy Patient Advocate Encounter  Received notification from [employer] that Prior Authorization for hydrOXYzine  Pamoate 25MG  capsules has been APPROVED from 09/15/2024 to 11/26/2025. Ran test claim, Copay is $6.33. This test claim was processed through [facility]- copay amounts may vary at other pharmacies due to pharmacy/plan contracts, or as the patient moves through the different stages of their insurance plan.   PA #/Case ID/Reference #: [id number]

## 2024-09-15 NOTE — Telephone Encounter (Signed)
 Pharmacy Patient Advocate Encounter   Received notification from CoverMyMeds that prior authorization for hydrOXYzine  Pamoate 25MG  capsules is required/requested.   Insurance verification completed.   The patient is insured through [place].   Per test claim: PA required; PA started via CoverMyMeds. KEY [id number] . Please see clinical question(s) below that I am not finding the answer to in their chart and advise.  Please advise on below. Patient was started on Vesicare on 09/09/2024 by Dr. Matilda.

## 2024-10-08 ENCOUNTER — Encounter (INDEPENDENT_AMBULATORY_CARE_PROVIDER_SITE_OTHER): Payer: Self-pay | Admitting: Gastroenterology

## 2024-10-10 ENCOUNTER — Ambulatory Visit (HOSPITAL_COMMUNITY): Attending: Urology

## 2024-11-04 ENCOUNTER — Other Ambulatory Visit: Admitting: Urology

## 2024-11-09 ENCOUNTER — Other Ambulatory Visit: Payer: Self-pay | Admitting: Internal Medicine

## 2024-11-09 DIAGNOSIS — F419 Anxiety disorder, unspecified: Secondary | ICD-10-CM

## 2024-11-24 ENCOUNTER — Ambulatory Visit (HOSPITAL_COMMUNITY)
Admission: RE | Admit: 2024-11-24 | Discharge: 2024-11-24 | Disposition: A | Discharge status: Home / Self Care | Source: Ambulatory Visit | Attending: Urology | Admitting: Urology

## 2024-11-24 DIAGNOSIS — R3129 Other microscopic hematuria: Secondary | ICD-10-CM | POA: Diagnosis present

## 2024-11-24 DIAGNOSIS — F419 Anxiety disorder, unspecified: Secondary | ICD-10-CM | POA: Diagnosis present

## 2024-11-24 MED ORDER — IOHEXOL 300 MG/ML  SOLN
125.0000 mL | Freq: Once | INTRAMUSCULAR | Status: AC | PRN
  Administered 2024-11-24: 125 mL via INTRAVENOUS

## 2024-12-01 NOTE — Progress Notes (Signed)
 "   Assessment:  -Urinary frequency and urgency, OAB symptoms, bothersome.  On solifenacin , improved symptoms  -Microscopic hematuria and long-term smoker.  Cystoscopy today negative.   Plan:   - Continue solifenacin   - I do not think he needs follow-up here unless change in urination/gross hematuria   HPI:   10.14.2025: Initial visit for evaluation and management of lower urinary tract symptoms.  For several years he has had urinary frequency and urgency as well as nocturia.  Also with urgency incontinence.  Quite bothersome to the patient.  Apparently he was put on Myrbetriq  50 mg back in July.  This helped his symptoms but his prescription has run out.  He has a good stream.  He feels like he empties well.  He does have somewhat slow mobility.  He has no gross hematuria or dysuria.  No prior history of urinary tract infections.  He is a lifelong chain smoker.  1.6.2026: Hematuria CT was performed 3 days ago.  Results: IMPRESSION: No radiographic evidence of urinary tract neoplasm, urolithiasis, or hydronephrosis.   Mildly enlarged prostate.   Cholelithiasis. No radiographic evidence of cholecystitis.   5 mm indeterminate left lower lobe pulmonary nodule. No follow-up needed if patient is low-risk. Non-contrast chest CT can be considered in 12 months if patient is high-risk.  1.6.2026: Here today for cystoscopy.  He thinks solifenacin  has helped his urination.  PMH: Past Medical History:  Diagnosis Date   Anxiety    Brain aneurysm    Hypertension     Surgical History: Past Surgical History:  Procedure Laterality Date   BRAIN SURGERY     CATARACT EXTRACTION W/ INTRAOCULAR LENS IMPLANT Bilateral    IR RADIOLOGIST EVAL & MGMT  07/03/2017    Home Medications:  Allergies as of 12/02/2024       Reactions   Cephalexin Hives, Rash        Medication List        Accurate as of December 01, 2024  3:22 PM. If you have any questions, ask your nurse or doctor.           albuterol  108 (90 Base) MCG/ACT inhaler Commonly known as: VENTOLIN  HFA Inhale 2 puffs into the lungs every 6 (six) hours as needed for wheezing or shortness of breath.   amLODipine  5 MG tablet Commonly known as: NORVASC  Take 1 tablet (5 mg total) by mouth daily.   aspirin  EC 325 MG tablet Take 325 mg by mouth daily as needed for mild pain or moderate pain.   atorvastatin  20 MG tablet Commonly known as: LIPITOR Take 1 tablet (20 mg total) by mouth daily.   Breztri  Aerosphere 160-9-4.8 MCG/ACT Aero inhaler Generic drug: budesonide-glycopyrrolate-formoterol Inhale 2 puffs into the lungs 2 (two) times daily.   Breztri  Aerosphere 160-9-4.8 MCG/ACT Aero inhaler Generic drug: budesonide-glycopyrrolate-formoterol Inhale 2 puffs into the lungs 2 (two) times daily.   buPROPion  150 MG 24 hr tablet Commonly known as: WELLBUTRIN  XL Take 1 tablet by mouth once daily   carvedilol  6.25 MG tablet Commonly known as: COREG  Take 1 tablet (6.25 mg total) by mouth 2 (two) times daily with a meal.   escitalopram  20 MG tablet Commonly known as: LEXAPRO  Take 1 tablet (20 mg total) by mouth daily.   hydrOXYzine  25 MG capsule Commonly known as: VISTARIL  TAKE 1 CAPSULE BY MOUTH THREE TIMES DAILY   meloxicam  7.5 MG tablet Commonly known as: MOBIC  Take 1 tablet (7.5 mg total) by mouth daily.   Misc. Devices Misc Blood  pressure cuff - 1.   QUEtiapine  25 MG tablet Commonly known as: SEROquel  Take 1 tablet (25 mg total) by mouth 2 (two) times daily.   solifenacin  10 MG tablet Commonly known as: VESICARE  Take 1 tablet (10 mg total) by mouth daily.   telmisartan  80 MG tablet Commonly known as: MICARDIS  Take 1 tablet (80 mg total) by mouth daily.        Allergies:  Allergies  Allergen Reactions   Cephalexin Hives and Rash    Family History: No family history on file.  Social History:  reports that he has been smoking cigarettes. He has never used smokeless tobacco. He reports  that he does not drink alcohol and does not use drugs.   Laboratory Data: Lab Results  Component Value Date   WBC 11.4 (H) 07/31/2024   HGB 14.0 07/31/2024   HCT 43.0 07/31/2024   MCV 94 07/31/2024   PLT 327 07/31/2024    Lab Results  Component Value Date   CREATININE 0.85 07/31/2024    Most recent PSA from 2024 1.4   Cystoscopy Procedure Note:  Indication: Microscopic hematuria  After informed consent and discussion of the procedure and its risks, Brett Elliott was positioned and prepped in the standard fashion.  Cystoscopy was performed with a flexible cystoscope.   Findings: Urethra: No lesions, no stricture Prostate: Nonobstructive.  No lesions Bladder neck: Open Ureteral orifices: Normal bilaterally Bladder: No urothelial lesions.  No trabeculations, no foreign bodies  The patient tolerated the procedure well.       "

## 2024-12-02 ENCOUNTER — Ambulatory Visit: Admitting: Urology

## 2024-12-02 VITALS — BP 145/80 | HR 67

## 2024-12-02 DIAGNOSIS — F17210 Nicotine dependence, cigarettes, uncomplicated: Secondary | ICD-10-CM

## 2024-12-02 DIAGNOSIS — R32 Unspecified urinary incontinence: Secondary | ICD-10-CM

## 2024-12-02 DIAGNOSIS — N3281 Overactive bladder: Secondary | ICD-10-CM | POA: Diagnosis not present

## 2024-12-02 DIAGNOSIS — R3129 Other microscopic hematuria: Secondary | ICD-10-CM

## 2024-12-02 LAB — URINALYSIS, ROUTINE W REFLEX MICROSCOPIC
Bilirubin, UA: NEGATIVE
Glucose, UA: NEGATIVE
Ketones, UA: NEGATIVE
Leukocytes,UA: NEGATIVE
Nitrite, UA: NEGATIVE
Specific Gravity, UA: 1.015 (ref 1.005–1.030)
Urobilinogen, Ur: 1 mg/dL (ref 0.2–1.0)
pH, UA: 6 (ref 5.0–7.5)

## 2024-12-02 LAB — MICROSCOPIC EXAMINATION: Bacteria, UA: NONE SEEN

## 2024-12-02 LAB — POCT I-STAT CREATININE: Creatinine, Ser: 1 mg/dL (ref 0.61–1.24)

## 2024-12-02 MED ORDER — CIPROFLOXACIN HCL 500 MG PO TABS
500.0000 mg | ORAL_TABLET | Freq: Once | ORAL | Status: AC
  Administered 2024-12-02: 500 mg via ORAL

## 2024-12-23 ENCOUNTER — Ambulatory Visit: Admitting: Internal Medicine

## 2025-01-29 ENCOUNTER — Ambulatory Visit: Admitting: Internal Medicine
